# Patient Record
Sex: Female | Born: 1980 | Race: Asian | Hispanic: No | Smoking: Never smoker
Health system: Southern US, Community
[De-identification: ages and names within clinical notes are randomized; demographics above are authoritative.]

## PROBLEM LIST (undated history)

## (undated) DIAGNOSIS — D649 Anemia, unspecified: Secondary | ICD-10-CM

## (undated) DIAGNOSIS — K219 Gastro-esophageal reflux disease without esophagitis: Secondary | ICD-10-CM

## (undated) HISTORY — DX: Anemia, unspecified: D64.9

---

## 2006-05-06 ENCOUNTER — Inpatient Hospital Stay (HOSPITAL_COMMUNITY): Admission: AD | Admit: 2006-05-06 | Discharge: 2006-05-08 | Payer: Self-pay | Admitting: Obstetrics & Gynecology

## 2006-05-06 ENCOUNTER — Ambulatory Visit: Payer: Self-pay | Admitting: Certified Nurse Midwife

## 2008-06-24 ENCOUNTER — Ambulatory Visit: Payer: Self-pay | Admitting: Family

## 2008-06-24 ENCOUNTER — Ambulatory Visit: Payer: Self-pay | Admitting: Obstetrics & Gynecology

## 2008-06-24 ENCOUNTER — Inpatient Hospital Stay (HOSPITAL_COMMUNITY): Admission: AD | Admit: 2008-06-24 | Discharge: 2008-06-26 | Payer: Self-pay | Admitting: Obstetrics & Gynecology

## 2008-06-24 ENCOUNTER — Encounter: Payer: Self-pay | Admitting: Family

## 2008-06-24 LAB — CONVERTED CEMR LAB
ALT: 31 units/L (ref 0–35)
AST: 48 units/L — ABNORMAL HIGH (ref 0–37)
Antibody Screen: NEGATIVE
BUN: 8 mg/dL (ref 6–23)
CO2: 23 meq/L (ref 19–32)
Eosinophils Absolute: 0.3 10*3/uL (ref 0.0–0.7)
Eosinophils Relative: 4 % (ref 0–5)
HCT: 37.1 % (ref 36.0–46.0)
Lymphocytes Relative: 24 % (ref 12–46)
Lymphs Abs: 1.7 10*3/uL (ref 0.7–4.0)
MCHC: 31 g/dL (ref 30.0–36.0)
Monocytes Absolute: 0.5 10*3/uL (ref 0.1–1.0)
Neutrophils Relative %: 64 % (ref 43–77)
Platelets: 347 10*3/uL (ref 150–400)
Potassium: 4 meq/L (ref 3.5–5.3)
RDW: 13.9 % (ref 11.5–15.5)
Rubella: 49.3 intl units/mL — ABNORMAL HIGH
Total Bilirubin: 0.5 mg/dL (ref 0.3–1.2)
Total Protein: 6.8 g/dL (ref 6.0–8.3)

## 2008-12-22 ENCOUNTER — Emergency Department (HOSPITAL_COMMUNITY): Admission: EM | Admit: 2008-12-22 | Discharge: 2008-12-22 | Payer: Self-pay | Admitting: Emergency Medicine

## 2009-01-20 ENCOUNTER — Encounter: Payer: Self-pay | Admitting: Family Medicine

## 2009-01-20 ENCOUNTER — Ambulatory Visit: Payer: Self-pay | Admitting: Family Medicine

## 2009-01-20 DIAGNOSIS — R5383 Other fatigue: Secondary | ICD-10-CM

## 2009-01-20 DIAGNOSIS — K219 Gastro-esophageal reflux disease without esophagitis: Secondary | ICD-10-CM | POA: Insufficient documentation

## 2009-01-20 DIAGNOSIS — R5381 Other malaise: Secondary | ICD-10-CM

## 2009-01-20 HISTORY — DX: Gastro-esophageal reflux disease without esophagitis: K21.9

## 2009-01-28 ENCOUNTER — Encounter: Payer: Self-pay | Admitting: Family Medicine

## 2009-01-28 LAB — CONVERTED CEMR LAB
HCT: 38.5 % (ref 36.0–46.0)
HDL: 41 mg/dL (ref >39)
LDL Cholesterol: 62 mg/dL (ref 0–99)
MCV: 70.5 fL — ABNORMAL LOW (ref 78.0–100.0)
Platelets: 391 10*3/uL (ref 150–400)
RBC: 5.46 M/uL — ABNORMAL HIGH (ref 3.87–5.11)
Total CHOL/HDL Ratio: 3
Triglycerides: 89 mg/dL (ref <150)
WBC: 7.3 10*3/uL (ref 4.0–10.5)

## 2009-04-15 ENCOUNTER — Ambulatory Visit: Payer: Self-pay | Admitting: Family Medicine

## 2009-04-15 DIAGNOSIS — R1013 Epigastric pain: Secondary | ICD-10-CM | POA: Insufficient documentation

## 2009-04-15 LAB — CONVERTED CEMR LAB: H Pylori IgG: NEGATIVE

## 2009-04-25 ENCOUNTER — Emergency Department (HOSPITAL_COMMUNITY): Admission: EM | Admit: 2009-04-25 | Discharge: 2009-04-26 | Payer: Self-pay | Admitting: Emergency Medicine

## 2009-04-28 ENCOUNTER — Encounter: Payer: Self-pay | Admitting: Family Medicine

## 2009-04-28 ENCOUNTER — Ambulatory Visit: Payer: Self-pay | Admitting: Family Medicine

## 2009-04-28 DIAGNOSIS — O039 Complete or unspecified spontaneous abortion without complication: Secondary | ICD-10-CM | POA: Insufficient documentation

## 2009-04-28 LAB — CONVERTED CEMR LAB
Hemoglobin: 7.3 g/dL — CL (ref 12.0–15.0)
RDW: 15.9 % — ABNORMAL HIGH (ref 11.5–15.5)
hCG, Beta Chain, Quant, S: 134 milliintl units/mL

## 2009-04-30 ENCOUNTER — Ambulatory Visit (HOSPITAL_COMMUNITY): Admission: RE | Admit: 2009-04-30 | Discharge: 2009-04-30 | Payer: Self-pay | Admitting: Family Medicine

## 2009-05-06 ENCOUNTER — Ambulatory Visit: Payer: Self-pay | Admitting: Family Medicine

## 2009-05-06 ENCOUNTER — Encounter: Payer: Self-pay | Admitting: Family Medicine

## 2009-05-06 DIAGNOSIS — D369 Benign neoplasm, unspecified site: Secondary | ICD-10-CM | POA: Insufficient documentation

## 2009-05-06 DIAGNOSIS — D270 Benign neoplasm of right ovary: Secondary | ICD-10-CM | POA: Insufficient documentation

## 2009-05-06 LAB — CONVERTED CEMR LAB
HCT: 28 % — ABNORMAL LOW (ref 36.0–46.0)
Hemoglobin: 8.9 g/dL — ABNORMAL LOW (ref 12.0–15.0)
MCHC: 31.6 g/dL (ref 30.0–36.0)

## 2010-07-17 NOTE — L&D Delivery Note (Signed)
Delivery Note Pt came in C/C/+3, membranes intact.  After about 3 contractions, at 10:26 PM a viable female was delivered via Vaginal, Spontaneous Delivery (Presentation:left Occiput Anterior). Membranes ruptured spontaneously with delivery of head, clear fluid. APGAR: 9, 9; weight pending..   Placenta status: intact, .  Cord: 3 vessels with the following complications: None.  Cord pH: not done.  Delivery by Joellyn Haff, SNM.  Anesthesia: None  Episiotomy: None Lacerations: None Suture Repair:  Est. Blood Loss (mL):   Mom to postpartum.  Baby to nursery-stable.  Cassandra Lee,Cassandra Lee 04/20/2011, 10:50 PM

## 2010-08-07 ENCOUNTER — Encounter: Payer: Self-pay | Admitting: Family Medicine

## 2010-08-29 ENCOUNTER — Encounter: Payer: Self-pay | Admitting: *Deleted

## 2010-10-20 LAB — DIFFERENTIAL
Basophils Absolute: 0.1 10*3/uL (ref 0.0–0.1)
Basophils Absolute: 0.1 10*3/uL (ref 0.0–0.1)
Eosinophils Absolute: 0.1 10*3/uL (ref 0.0–0.7)
Eosinophils Relative: 1 % (ref 0–5)
Eosinophils Relative: 4 % (ref 0–5)
Lymphocytes Relative: 11 % — ABNORMAL LOW (ref 12–46)
Lymphs Abs: 1.6 10*3/uL (ref 0.7–4.0)
Monocytes Relative: 3 % (ref 3–12)
Neutrophils Relative %: 81 % — ABNORMAL HIGH (ref 43–77)

## 2010-10-20 LAB — CROSSMATCH: Antibody Screen: NEGATIVE

## 2010-10-20 LAB — CBC
HCT: 33 % — ABNORMAL LOW (ref 36.0–46.0)
Hemoglobin: 10.6 g/dL — ABNORMAL LOW (ref 12.0–15.0)
Hemoglobin: 8.2 g/dL — ABNORMAL LOW (ref 12.0–15.0)
MCHC: 32 g/dL (ref 30.0–36.0)
Platelets: 276 10*3/uL (ref 150–400)
RBC: 3.61 MIL/uL — ABNORMAL LOW (ref 3.87–5.11)
RDW: 15.2 % (ref 11.5–15.5)
RDW: 15.3 % (ref 11.5–15.5)
WBC: 15.5 10*3/uL — ABNORMAL HIGH (ref 4.0–10.5)

## 2010-10-20 LAB — ETHANOL: Alcohol, Ethyl (B): <5 mg/dL (ref 0–10)

## 2010-10-20 LAB — COMPREHENSIVE METABOLIC PANEL
ALT: 21 U/L (ref 0–35)
Alkaline Phosphatase: 51 U/L (ref 39–117)
BUN: 14 mg/dL (ref 6–23)
Calcium: 8.4 mg/dL (ref 8.4–10.5)
Chloride: 112 mEq/L (ref 96–112)
GFR calc Af Amer: >60 mL/min (ref >60)
Potassium: 3.1 mEq/L — ABNORMAL LOW (ref 3.5–5.1)
Total Bilirubin: 0.7 mg/dL (ref 0.3–1.2)

## 2010-10-20 LAB — URINALYSIS, ROUTINE W REFLEX MICROSCOPIC
Bilirubin Urine: NEGATIVE
Ketones, ur: 15 mg/dL — AB
Nitrite: NEGATIVE
Protein, ur: 30 mg/dL — AB
Specific Gravity, Urine: 1.024 (ref 1.005–1.030)
pH: 5.5 (ref 5.0–8.0)

## 2010-10-20 LAB — RAPID URINE DRUG SCREEN, HOSP PERFORMED
Opiates: NOT DETECTED
Tetrahydrocannabinol: NOT DETECTED

## 2010-10-20 LAB — URINE MICROSCOPIC-ADD ON

## 2010-10-20 LAB — POCT CARDIAC MARKERS
CKMB, poc: 1 ng/mL (ref 1.0–8.0)
Myoglobin, poc: 57.7 ng/mL (ref 12–200)
Troponin i, poc: <0.05 ng/mL (ref 0.00–0.09)

## 2010-10-24 LAB — POCT I-STAT, CHEM 8
Creatinine, Ser: 0.7 mg/dL (ref 0.4–1.2)
Hemoglobin: 15 g/dL (ref 12.0–15.0)
Sodium: 138 mEq/L (ref 135–145)
TCO2: 26 mmol/L (ref 0–100)

## 2010-10-24 LAB — POCT URINALYSIS DIP (DEVICE)
Glucose, UA: NEGATIVE mg/dL
Hgb urine dipstick: NEGATIVE
Nitrite: NEGATIVE
Urobilinogen, UA: 1 mg/dL (ref 0.0–1.0)

## 2010-10-24 LAB — POCT PREGNANCY, URINE: Preg Test, Ur: NEGATIVE

## 2010-11-21 ENCOUNTER — Other Ambulatory Visit: Payer: Self-pay | Admitting: Family Medicine

## 2010-11-21 DIAGNOSIS — Z3689 Encounter for other specified antenatal screening: Secondary | ICD-10-CM

## 2010-11-29 NOTE — Discharge Summary (Signed)
Cassandra Lee, Cassandra Lee                     ACCOUNT NO.:  [id number]   MEDICAL RECORD NO.:  [id number]          PATIENT TYPE:  INP   LOCATION:  9374                          FACILITY:  [facility]   PHYSICIAN:  Norton Blizzard, MD    DATE OF BIRTH:  [birth year]   DATE OF ADMISSION:  06/24/2008  DATE OF DISCHARGE:  06/26/2008                               DISCHARGE SUMMARY   DISCHARGE DIAGNOSES:  1. Term intrauterine pregnancy, delivered.  2. Status post normal spontaneous vaginal delivery.  3. No prenatal care.  4. Preeclampsia.   REASON FOR ADMISSION:  Cassandra Lee is a 30 year old, gravida 2, now para 2,  who presented with no prenatal care and elevated blood pressures,  elevated liver enzymes, and elevated uric acid.  Her initial ultrasound  was performed on the day of her admission and she was found to have a 6-  pound 8-ounce baby and be approximately 35 weeks and 2 days by  ultrasound on the day of admission.   HOSPITAL COURSE:  The patient was admitted and found to be in active  labor.  She has started magnesium sulfate given her elevated liver  enzymes, her elevated blood pressure, and her elevated uric acid.  Her  labor progressed in an uncomplicated manner and she had a normal  spontaneous vaginal delivery of a 7-pound 0-ounce female baby with  Apgars of 9 and 9.  Postoperatively, she was transferred to the [facility] for  24 hours of magnesium sulfate therapy.  Upon completion of her magnesium  sulfate therapy, her blood pressure was completely normal in the 110/70s  range.  Her physical exam is unremarkable.  Her urine output is  excellent.  She is ambulating and tolerating a full diet.  Her lochia is  decreasing and she has minimal pain, well controlled with non-narcotic  pain relievers.  At the time of discharge, an interpreter was here, who  explained to her all the discharge instructions to include the  importance of a followup in 6 weeks for her postpartum exam and followup  for continued  birth control be it through Depo-Provera or possible IUD  or Implanon.  The patient will be given a shot of Depo-Provera before  she leaves the hospital.   CONDITION AT DISCHARGE:  Good.   INSTRUCTIONS FOR DISCHARGE:  As per hospital course.  The patient was  given a prescription for Ibuprofen 600 mg every 6 hours as needed for  pain.  Additionally, paper work was completed for the patient to return  to work after 6 weeks.  The patient's questions were answered and, via  the interpreter, she voiced understanding of the above instructions.      Odie Sera, DO  Electronically Signed     ______________________________  Norton Blizzard, MD    MC/MEDQ  D:  06/26/2008  T:  06/27/2008  Job:  [id number]

## 2010-12-19 ENCOUNTER — Ambulatory Visit (HOSPITAL_COMMUNITY)
Admission: RE | Admit: 2010-12-19 | Discharge: 2010-12-19 | Disposition: A | Discharge status: Home / Self Care | Payer: 59 | Source: Ambulatory Visit | Attending: Family Medicine | Admitting: Family Medicine

## 2010-12-19 DIAGNOSIS — Z3689 Encounter for other specified antenatal screening: Secondary | ICD-10-CM

## 2010-12-19 DIAGNOSIS — O358XX0 Maternal care for other (suspected) fetal abnormality and damage, not applicable or unspecified: Secondary | ICD-10-CM | POA: Insufficient documentation

## 2010-12-19 DIAGNOSIS — Z1389 Encounter for screening for other disorder: Secondary | ICD-10-CM | POA: Insufficient documentation

## 2010-12-19 DIAGNOSIS — Z363 Encounter for antenatal screening for malformations: Secondary | ICD-10-CM | POA: Insufficient documentation

## 2011-02-13 ENCOUNTER — Inpatient Hospital Stay (HOSPITAL_COMMUNITY): Admission: AD | Admit: 2011-02-13 | Payer: Self-pay | Source: Ambulatory Visit | Admitting: Obstetrics and Gynecology

## 2011-04-20 ENCOUNTER — Encounter (HOSPITAL_COMMUNITY): Payer: Self-pay | Admitting: *Deleted

## 2011-04-20 ENCOUNTER — Inpatient Hospital Stay (HOSPITAL_COMMUNITY)
Admission: AD | Admit: 2011-04-20 | Discharge: 2011-04-22 | DRG: 775 | Disposition: A | Discharge status: Home / Self Care | Payer: 59 | Source: Ambulatory Visit | Attending: Obstetrics & Gynecology | Admitting: Obstetrics & Gynecology

## 2011-04-20 ENCOUNTER — Other Ambulatory Visit: Payer: Self-pay | Admitting: Family Medicine

## 2011-04-20 MED ORDER — ERYTHROMYCIN 5 MG/GM OP OINT
TOPICAL_OINTMENT | OPHTHALMIC | Status: AC
  Filled 2011-04-20: qty 1

## 2011-04-20 MED ORDER — OXYCODONE-ACETAMINOPHEN 5-325 MG PO TABS
1.0000 | ORAL_TABLET | ORAL | Status: DC | PRN
  Administered 2011-04-20 – 2011-04-21 (×3): 1 via ORAL
  Filled 2011-04-20 (×3): qty 1

## 2011-04-20 MED ORDER — IBUPROFEN 600 MG PO TABS
600.0000 mg | ORAL_TABLET | Freq: Four times a day (QID) | ORAL | Status: DC
  Administered 2011-04-21 – 2011-04-22 (×7): 600 mg via ORAL
  Filled 2011-04-20 (×7): qty 1

## 2011-04-20 MED ORDER — OXYTOCIN 10 UNIT/ML IJ SOLN
INTRAMUSCULAR | Status: AC
  Filled 2011-04-20: qty 1

## 2011-04-20 NOTE — Progress Notes (Signed)
PT FROM [place]- INTERPRETER- [name]-    SAYS HURT BAD   AT 9PM.  VE IN  OFFICE  3 CM- UNSURE IF SROM IN OFFICE- FOR U/S IN AM.

## 2011-04-20 NOTE — Progress Notes (Signed)
Pt was in bathroom in MAU and reported that the baby was "coming out". Pt transferred to MAU room 10 for delivery.

## 2011-04-20 NOTE — Progress Notes (Signed)
Pt went to b-room- WITH INTERPRETER- SAYS BABY COMING. - TO ROOM VIA W/C. VE - COMPLETE/.

## 2011-04-21 ENCOUNTER — Ambulatory Visit (HOSPITAL_COMMUNITY): Payer: Medicaid Other

## 2011-04-21 ENCOUNTER — Encounter (HOSPITAL_COMMUNITY): Payer: Self-pay | Admitting: *Deleted

## 2011-04-21 LAB — POCT URINALYSIS DIP (DEVICE)
Glucose, UA: NEGATIVE mg/dL
Hgb urine dipstick: NEGATIVE
Nitrite: NEGATIVE
Protein, ur: 30 mg/dL — AB
Urobilinogen, UA: 0.2 mg/dL (ref 0.0–1.0)

## 2011-04-21 LAB — RPR: RPR Ser Ql: NONREACTIVE

## 2011-04-21 LAB — CBC
HCT: 28.6 % — ABNORMAL LOW (ref 36.0–46.0)
HCT: 33.5 % — ABNORMAL LOW (ref 36.0–46.0)
MCHC: 31.5 g/dL (ref 30.0–36.0)
MCV: 73.1 fL — ABNORMAL LOW (ref 78.0–100.0)
Platelets: 270 10*3/uL (ref 150–400)
Platelets: 316 10*3/uL (ref 150–400)
Platelets: 358 10*3/uL (ref 150–400)
RBC: 4.94 MIL/uL (ref 3.87–5.11)
RDW: 14 % (ref 11.5–15.5)
RDW: 14 % (ref 11.5–15.5)
WBC: 12.6 10*3/uL — ABNORMAL HIGH (ref 4.0–10.5)
WBC: 7.2 10*3/uL (ref 4.0–10.5)
WBC: 7.2 10*3/uL (ref 4.0–10.5)

## 2011-04-21 LAB — ANA: Anti Nuclear Antibody(ANA): NEGATIVE

## 2011-04-21 MED ORDER — WITCH HAZEL-GLYCERIN EX PADS: 1.0000 "application " | MEDICATED_PAD | CUTANEOUS | Status: DC | PRN

## 2011-04-21 MED ORDER — BENZOCAINE-MENTHOL 20-0.5 % EX AERO: 1.0000 "application " | INHALATION_SPRAY | CUTANEOUS | Status: DC | PRN

## 2011-04-21 MED ORDER — LANOLIN HYDROUS EX OINT: TOPICAL_OINTMENT | CUTANEOUS | Status: DC | PRN

## 2011-04-21 MED ORDER — ZOLPIDEM TARTRATE 5 MG PO TABS: 5.0000 mg | ORAL_TABLET | Freq: Every evening | ORAL | Status: DC | PRN

## 2011-04-21 MED ORDER — PRENATAL PLUS 27-1 MG PO TABS
1.0000 | ORAL_TABLET | Freq: Every day | ORAL | Status: DC
  Administered 2011-04-21 – 2011-04-22 (×2): 1 via ORAL
  Filled 2011-04-21 (×2): qty 1

## 2011-04-21 MED ORDER — DIBUCAINE 1 % RE OINT: 1.0000 "application " | TOPICAL_OINTMENT | RECTAL | Status: DC | PRN

## 2011-04-21 MED ORDER — SIMETHICONE 80 MG PO CHEW: 80.0000 mg | CHEWABLE_TABLET | ORAL | Status: DC | PRN

## 2011-04-21 MED ORDER — DIPHENHYDRAMINE HCL 25 MG PO CAPS: 25.0000 mg | ORAL_CAPSULE | Freq: Four times a day (QID) | ORAL | Status: DC | PRN

## 2011-04-21 MED ORDER — MEASLES, MUMPS & RUBELLA VAC ~~LOC~~ INJ
0.5000 mL | INJECTION | Freq: Once | SUBCUTANEOUS | Status: DC
Start: 2011-04-22 — End: 2011-04-22
  Filled 2011-04-21: qty 0.5

## 2011-04-21 MED ORDER — ONDANSETRON HCL 4 MG PO TABS: 4.0000 mg | ORAL_TABLET | ORAL | Status: DC | PRN

## 2011-04-21 MED ORDER — SENNOSIDES-DOCUSATE SODIUM 8.6-50 MG PO TABS
2.0000 | ORAL_TABLET | Freq: Every day | ORAL | Status: DC
  Administered 2011-04-21: 2 via ORAL

## 2011-04-21 MED ORDER — TETANUS-DIPHTH-ACELL PERTUSSIS 5-2.5-18.5 LF-MCG/0.5 IM SUSP
0.5000 mL | Freq: Once | INTRAMUSCULAR | Status: AC
  Administered 2011-04-22: 0.5 mL via INTRAMUSCULAR
  Filled 2011-04-21: qty 0.5

## 2011-04-21 MED ORDER — ONDANSETRON HCL 4 MG/2ML IJ SOLN: 4.0000 mg | INTRAMUSCULAR | Status: DC | PRN

## 2011-04-21 NOTE — Progress Notes (Signed)
Post Partum Day 1 Subjective: no complaints, up ad lib, voiding and tolerating PO, small lochia, plans to breastfeed, condoms  Objective: Blood pressure 100/60, pulse 67, temperature 97.8 F (36.6 C), temperature source Oral, resp. rate 16, height 4\' 11"  (1.499 m), SpO2 98.00%.  Physical Exam:  General: alert, cooperative and no distress Lochia:normal flow Chest: CTAB Heart: RRR no m/r/g Abdomen: +BS, soft, nontender,  Uterine Fundus: firm DVT Evaluation: No evidence of DVT seen on physical exam. Extremities: 0 edema  No results found for this basename: HGB:2,HCT:2 in the last 72 hours  Assessment/Plan: Plan for discharge tomorrow   LOS: 1 day   CRESENZO-DISHMAN,Whitleigh Garramone 04/21/2011, 8:03 AM

## 2011-04-21 NOTE — Progress Notes (Signed)
UR Chart review completed.  

## 2011-04-22 MED ORDER — INFLUENZA VIRUS VACC SPLIT PF IM SUSP
0.5000 mL | Freq: Once | INTRAMUSCULAR | Status: AC
  Administered 2011-04-22: 0.5 mL via INTRAMUSCULAR
  Filled 2011-04-22: qty 0.5

## 2011-04-22 MED ORDER — DOCUSATE SODIUM 100 MG PO CAPS: 100.0000 mg | ORAL_CAPSULE | Freq: Two times a day (BID) | ORAL | Status: AC

## 2011-04-22 MED ORDER — IBUPROFEN 600 MG PO TABS: 600.0000 mg | ORAL_TABLET | Freq: Four times a day (QID) | ORAL | Status: AC

## 2011-04-22 MED ORDER — MEDROXYPROGESTERONE ACETATE 150 MG/ML IM SUSP
150.0000 mg | Freq: Once | INTRAMUSCULAR | Status: AC
  Administered 2011-04-22: 150 mg via INTRAMUSCULAR
  Filled 2011-04-22: qty 1

## 2011-04-22 NOTE — Discharge Summary (Signed)
Obstetric Discharge Summary Reason for Admission: onset of labor Prenatal Procedures: none Intrapartum Procedures: spontaneous vaginal delivery Postpartum Procedures: none Complications-Operative and Postpartum: none Hemoglobin  Date Value Range Status  05/06/2009 8.9* 12.0-15.0 (g/dL) Final     HCT  Date Value Range Status  05/06/2009 28.0* 36.0-46.0 (%) Final    Discharge Diagnoses: Term Pregnancy-delivered  Discharge Information: Date: 04/22/2011 Activity: pelvic rest Diet: routine Medications: Ibuprofen and Colace Condition: stable Instructions: refer to practice specific booklet Discharge to: home   Newborn Data: Live born female  Birth Weight: 7 lb 9.9 oz (3456 g) APGAR: 9, 9  Home with staying for elevated bilirubin.  STINSON, JACOB JEHIEL 04/22/2011, 11:06 AM

## 2011-04-22 NOTE — Progress Notes (Signed)
Post Partum Day 2 Subjective: no complaints, up ad lib, voiding and tolerating PO  Objective: Blood pressure 112/70, pulse 70, temperature 97.9 F (36.6 C), temperature source Oral, resp. rate 18, height 4\' 11"  (1.499 m), SpO2 98.00%.  Physical Exam:  General: alert, cooperative and no distress Lochia: appropriate Uterine Fundus: firm Incision: no incision DVT Evaluation: No cords or calf tenderness. No significant calf/ankle edema.  No results found for this basename: HGB:2,HCT:2 in the last 72 hours  Assessment/Plan: Discharge home and Breastfeeding   LOS: 2 days   Royann Wildasin JEHIEL 04/22/2011, 11:05 AM

## 2011-04-27 ENCOUNTER — Encounter (HOSPITAL_COMMUNITY): Payer: Self-pay | Admitting: *Deleted

## 2011-05-02 NOTE — H&P (Signed)
Cassandra Lee is a 30 y.o. female [phone] with IUP at [redacted]w[redacted]d presenting for labor. Pt states she has been having regular, every 3 minutes, associated with none vaginal bleeding, intact, with active.  Marland Kitchen  [facility] at [facility] since 14 wks  Prenatal History/Complications:  Past Medical History: History reviewed. No pertinent past medical history.  Past Surgical History: History reviewed. No pertinent past surgical history.  Obstetrical History: OB History    Grav Para Term Preterm Abortions TAB SAB Ect Mult Living   4 3 3  1  1   3       Gynecological History:negative   Social History: History   Social History  . Marital Status: Married    Spouse Name: N/A    Number of Children: N/A  . Years of Education: N/A   Social History Main Topics  . Smoking status: Never Smoker   . Smokeless tobacco: None  . Alcohol Use: No  . Drug Use: No  . Sexually Active:    Other Topics Concern  . None   Social History Narrative  . None    Family History: No family history on file.  Allergies: No Known Allergies  No prescriptions prior to admission    Review of Systems - Negative except contractions for 3 hours   Blood pressure 112/70, pulse 70, temperature 97.9 F (36.6 C), temperature source Oral, resp. rate 18, height 4\' 11"  (1.499 m), SpO2 98.00%, unknown if currently breastfeeding. General appearance: alert and moderate distress Lungs: clear to auscultation bilaterally Heart: regular rate and rhythm Abdomen: soft, non-tender; bowel sounds normal Pelvic: C/C/+3 Extremities: Homans sign is negative, no sign of DVT DTR's 2+ Presentation: cephalic Baseline: 140 bpm, avg LTV, + accels, no decels COntractions q 2 minutes, strong     Prenatal labs: ABO, Rh:  O+ Antibody:  neg Rubella:  immune RPR:   neg HBsAg:   neg HIV:   neg GBS:   neg 1 hr Glucola 79 genetic screening  Neg anatomy US normal   Assessment: Cassandra Lee is a 30 y.o. [id number] with an IUP at [redacted]w[redacted]d presenting for  advanced labor  Plan: Delivery imminent    CRESENZO-DISHMAN,Joey Hudock 05/02/2011, 11:34 AM

## 2012-07-17 NOTE — L&D Delivery Note (Signed)
Delivery Note At 8:29 PM a viable and healthy female was delivered via Vaginal, Spontaneous Delivery (Presentation: left occiput anterior).  APGAR: 9, 9; weight pending Placenta status: in tact, spontaneous.  Cord: 3-vessel  with the following complications: none.    Anesthesia:  None Episiotomy: None Lacerations: None Suture Repair: n/a Est. Blood Loss (mL): 250  Mom to postpartum.  Baby to nursery-stable.  Dr Ike Bene was present for entire delivery and delivered infant, I delivered placenta, no complications.  Simone Curia 04/20/2013, 9:15 PM   I spoke with and examined patient and agree with resident's note and plan of care.  Tawana Scale, MD OB Fellow 04/20/2013 9:42 PM

## 2012-07-17 NOTE — L&D Delivery Note (Signed)
Attestation of Attending Supervision of Advanced Practitioner (CNM/NP): Evaluation and management procedures were performed by the Advanced Practitioner under my supervision and collaboration. I have reviewed the Advanced Practitioner's note and chart, and I agree with the management and plan.  Alika Saladin H. 3:41 PM

## 2012-08-29 ENCOUNTER — Encounter: Payer: Self-pay | Admitting: Family Medicine

## 2012-09-03 ENCOUNTER — Encounter: Payer: Self-pay | Admitting: Family Medicine

## 2012-09-03 ENCOUNTER — Ambulatory Visit (INDEPENDENT_AMBULATORY_CARE_PROVIDER_SITE_OTHER): Payer: Medicaid Other | Admitting: Family Medicine

## 2012-09-03 VITALS — BP 123/84 | HR 83 | Temp 98.4°F | Ht 59.0 in | Wt 106.0 lb

## 2012-09-03 DIAGNOSIS — R51 Headache: Secondary | ICD-10-CM

## 2012-09-03 DIAGNOSIS — R519 Headache, unspecified: Secondary | ICD-10-CM | POA: Insufficient documentation

## 2012-09-03 DIAGNOSIS — N912 Amenorrhea, unspecified: Secondary | ICD-10-CM

## 2012-09-03 DIAGNOSIS — R1013 Epigastric pain: Secondary | ICD-10-CM

## 2012-09-03 DIAGNOSIS — R300 Dysuria: Secondary | ICD-10-CM

## 2012-09-03 DIAGNOSIS — Z3201 Encounter for pregnancy test, result positive: Secondary | ICD-10-CM

## 2012-09-03 LAB — POCT URINALYSIS DIPSTICK
Bilirubin, UA: NEGATIVE
Blood, UA: NEGATIVE
Glucose, UA: NEGATIVE
Leukocytes, UA: NEGATIVE
Nitrite, UA: NEGATIVE
Urobilinogen, UA: 0.2

## 2012-09-03 LAB — POCT UA - MICROSCOPIC ONLY

## 2012-09-03 LAB — POCT URINE PREGNANCY: Preg Test, Ur: POSITIVE

## 2012-09-03 MED ORDER — RANITIDINE HCL 150 MG PO TABS: 150.0000 mg | ORAL_TABLET | Freq: Two times a day (BID) | ORAL | Status: DC

## 2012-09-03 MED ORDER — PRENATAL PLUS 27-1 MG PO TABS: 1.0000 | ORAL_TABLET | Freq: Every day | ORAL | Status: DC

## 2012-09-03 NOTE — Assessment & Plan Note (Addendum)
Possibly related to mild dehydration.  No neurological deficits.  Advised she can use tyelnol for pain and be sure to hydrate.

## 2012-09-03 NOTE — Assessment & Plan Note (Addendum)
Positive UPT in clinic today.  She is unsure of date of LMP, thinks it was sometime in early January.  Will send for [place] for dating.  Advised her to start prenatal vitamin and schedule appointment for prenatal visit either with [place] or with [facility] where she was seen with previous pregnancy.  Urine does not show anything other than mild proteinuria and ketones, appears to have some dehydration. Advised to be sure she is getting plenty of fluids.

## 2012-09-03 NOTE — Patient Instructions (Addendum)
Your urine pregnancy test shows that you are pregnant. I would like to send you for an ulrasound since you are unsure of your dates. I think your abdominal pain is related to acid reflux, you can use tums or maalox for this You should start a prenatal vitamin You can get your prenatal care here or at the [facility] health dept as before.  Be sure to drink plenty of fluids each day.

## 2012-09-03 NOTE — Assessment & Plan Note (Signed)
Symptoms consistent with reflux.  Advised tums or maalox for control since she is pregnant.

## 2012-09-03 NOTE — Progress Notes (Signed)
  Subjective:    Patient ID: Cassandra Lee, female    DOB: 1980-10-20, 32 y.o.   MRN: [id number]  HPI  1. GI problem:  Complaint of epigastric pain for the past few months.  Often has sour taste in her mouth and burning pain in her chest.  She does endorse some mild nausea as well.  She denies any vomiting and has not had any dark stools.  SHe has not tried any medications for this.   2. Headache:  C/o headache for the past 2 days.  Some dizziness associated with this as well.  Headache is on L side and also in her neck.  She denies weakness, vision changes, numbness.   3. Dysuria:  Dysuria x2-3 days, with some frequency.  She does not have any flank pain, fever, chills, or hematuria.  Denies vaginal discharge.  Her LMP was in early January and she missed her period this month   History reviewed.  Pregnancy history reviewed, history of spontaneous miscarriage with last pregnancy.  Review of Systems Per HPI    Objective:   Physical Exam  Constitutional: She appears well-nourished. No distress.  HENT:  Head: Normocephalic and atraumatic.  Eyes: EOM are normal. Pupils are equal, round, and reactive to light.  Neck: Neck supple.  Cardiovascular: Normal rate.   Abdominal: Soft. Bowel sounds are normal. She exhibits no distension. There is tenderness (MIld epigastric tenderness.).  Musculoskeletal: She exhibits no edema.  Neurological: She is alert. No cranial nerve deficit. Coordination normal.          Assessment & Plan:

## 2012-09-05 ENCOUNTER — Other Ambulatory Visit: Payer: Self-pay | Admitting: Family Medicine

## 2012-09-05 ENCOUNTER — Ambulatory Visit (HOSPITAL_COMMUNITY)
Admission: RE | Admit: 2012-09-05 | Discharge: 2012-09-05 | Disposition: A | Discharge status: Home / Self Care | Payer: Medicaid Other | Source: Ambulatory Visit | Attending: Family Medicine | Admitting: Family Medicine

## 2012-09-05 DIAGNOSIS — O99891 Other specified diseases and conditions complicating pregnancy: Secondary | ICD-10-CM | POA: Insufficient documentation

## 2012-09-05 DIAGNOSIS — Z3689 Encounter for other specified antenatal screening: Secondary | ICD-10-CM | POA: Insufficient documentation

## 2012-09-05 DIAGNOSIS — R109 Unspecified abdominal pain: Secondary | ICD-10-CM | POA: Insufficient documentation

## 2012-09-05 DIAGNOSIS — Z3201 Encounter for pregnancy test, result positive: Secondary | ICD-10-CM

## 2012-09-13 ENCOUNTER — Telehealth: Payer: Self-pay | Admitting: Family Medicine

## 2012-09-13 NOTE — Telephone Encounter (Signed)
Confirmed pregnancy, needs NOB appt scheduled.

## 2012-09-13 NOTE — Telephone Encounter (Signed)
NOB appointments scheduled.  Interpreter notified.  Ileana Ladd

## 2012-09-23 ENCOUNTER — Other Ambulatory Visit: Payer: Medicaid Other

## 2012-09-23 DIAGNOSIS — Z331 Pregnant state, incidental: Secondary | ICD-10-CM

## 2012-09-23 NOTE — Progress Notes (Signed)
PRENATAL LABS DONE TODAY Cassandra Lee 

## 2012-09-24 LAB — OBSTETRIC PANEL
Antibody Screen: NEGATIVE
Basophils Absolute: 0.1 10*3/uL (ref 0.0–0.1)
Basophils Relative: 1 % (ref 0–1)
Eosinophils Relative: 7 % — ABNORMAL HIGH (ref 0–5)
HCT: 36.4 % (ref 36.0–46.0)
Hemoglobin: 11.1 g/dL — ABNORMAL LOW (ref 12.0–15.0)
MCH: 21.7 pg — ABNORMAL LOW (ref 26.0–34.0)
MCHC: 30.5 g/dL (ref 30.0–36.0)
MCV: 71.2 fL — ABNORMAL LOW (ref 78.0–100.0)
Monocytes Absolute: 0.5 10*3/uL (ref 0.1–1.0)
Monocytes Relative: 5 % (ref 3–12)
Neutro Abs: 6.3 10*3/uL (ref 1.7–7.7)
RDW: 16.3 % — ABNORMAL HIGH (ref 11.5–15.5)
Rh Type: POSITIVE

## 2012-09-24 LAB — CULTURE, OB URINE
Colony Count: NO GROWTH
Organism ID, Bacteria: NO GROWTH

## 2012-09-24 LAB — HIV ANTIBODY (ROUTINE TESTING W REFLEX): HIV: NONREACTIVE

## 2012-09-27 ENCOUNTER — Encounter: Payer: Self-pay | Admitting: Family Medicine

## 2012-09-27 DIAGNOSIS — Z3480 Encounter for supervision of other normal pregnancy, unspecified trimester: Secondary | ICD-10-CM | POA: Insufficient documentation

## 2012-09-30 ENCOUNTER — Ambulatory Visit (INDEPENDENT_AMBULATORY_CARE_PROVIDER_SITE_OTHER): Payer: Medicaid Other | Admitting: Family Medicine

## 2012-09-30 ENCOUNTER — Other Ambulatory Visit (HOSPITAL_COMMUNITY)
Admission: RE | Admit: 2012-09-30 | Discharge: 2012-09-30 | Disposition: A | Discharge status: Home / Self Care | Payer: Medicaid Other | Source: Ambulatory Visit | Attending: Family Medicine | Admitting: Family Medicine

## 2012-09-30 ENCOUNTER — Encounter: Payer: Self-pay | Admitting: Family Medicine

## 2012-09-30 VITALS — BP 115/79 | Temp 98.1°F | Wt 107.8 lb

## 2012-09-30 DIAGNOSIS — Z113 Encounter for screening for infections with a predominantly sexual mode of transmission: Secondary | ICD-10-CM | POA: Insufficient documentation

## 2012-09-30 DIAGNOSIS — D509 Iron deficiency anemia, unspecified: Secondary | ICD-10-CM | POA: Insufficient documentation

## 2012-09-30 DIAGNOSIS — Z3481 Encounter for supervision of other normal pregnancy, first trimester: Secondary | ICD-10-CM

## 2012-09-30 DIAGNOSIS — Z1151 Encounter for screening for human papillomavirus (HPV): Secondary | ICD-10-CM | POA: Insufficient documentation

## 2012-09-30 DIAGNOSIS — Z3480 Encounter for supervision of other normal pregnancy, unspecified trimester: Secondary | ICD-10-CM

## 2012-09-30 DIAGNOSIS — Z01419 Encounter for gynecological examination (general) (routine) without abnormal findings: Secondary | ICD-10-CM | POA: Insufficient documentation

## 2012-09-30 MED ORDER — COMPLETENATE 29-1 MG PO CHEW: 1.0000 | CHEWABLE_TABLET | Freq: Every day | ORAL | Status: DC

## 2012-09-30 NOTE — Progress Notes (Signed)
Cassandra Lee is a 32 y.o. [id number] at [redacted]w[redacted]d here for her first prenatal visit. Her nephew's girlfriend has known pt for 7 years and both feel comfortable with her interpreting.  S: Pt is happy about this pregnancy - Nausea and vomiting 1-2x/day but otherwise feels fine. This is making it hard for her to take PNV. - Denies contractions, vaginal bleeding, or LOF; has thin white discharge with no pain or discomfort. - One painful back tooth - Denies CP, SOB, fever, chills, headache, vision changes, rash, diarrhea, abdominal pain, edema  PMH:  - Anemia with previous pregnancies - Medications: Prenatal vitamin (not taking due to nausea/vomiting) - NKDA - No surgeries or hospitalizations - [id number]; Previous pregnancies all full-term NSVDs with no complications with preg or deliveries at [facility] - Pap smear 2 years ago with last pregnancy, always normal, all at [facility]  FH: Pt does not believe any h/o medical diseases, childhood illness, sudden childhood death, chromosomal abnormalities, or birth defects  SH:  - Pt denies tobacco use/exposure or drug or alcohol use - Denies DV - Lives with husband and 3 children - Feels supported  O:  BP 115/79  Temp(Src) 98.1 F (36.7 C)  Wt 107 lb 12.8 oz (48.898 kg)  BMI 21.76 kg/m2  GEN: NAD, sitting on exam table HEENT: Poor dentition but no obvious signs of infection, abscess, purulence, or erythema CV: RRR, no murmurs, rubs, or gallops PULM: CTAB, no wheezes or crackles, normal effort ABD: Soft, nontender, nondistended, no fundus palpated, no FHT auscultated yet EXTR: No edema SKIN: No rash GU: External genitalia WNL; thin vaginal discharge; cervix appears erythematous but non purulent and no cervical motion tenderness; thick and closed; bimanual exam WNL  A/P: 32 y.o. [id number] at [redacted]w[redacted]d by [redacted]w[redacted]d [place] with low-risk IUP here for first OB visit - Pap smear and GC/chlamydia today - call pt with results - Pt signed release to obtain records  from [facility] Dept; find out when last pap, TDAP, flu shot, varicella, and supplemental details on previous  pregnancies - Recommended prenatal vitamins in early pregnancy - changed rx to gummy and told pt she can also get OTC if she likes different brand - Encouraged seeing dentist who takes Medicaid, frequent small meals - Filled out [facility] (no risk identified) and PHQ-9 (Score 1 (tired/little energy) with these problems making it "Not difficulty at all" to function). - Plans to get [facility] and breast/bottle feed; discuss birth control at f/u - f/u 4 weeks: Ask if wants quad screen (16-18 wks), fill out family interpreter form, schedule anatomy US, go over genetic/inf screen tab, schedule prenatal clinic w/Dr. Mauricio Po, offer cystic fibrosis?

## 2012-09-30 NOTE — Progress Notes (Signed)
Please discuss tubal ligation

## 2012-09-30 NOTE — Patient Instructions (Addendum)
It was good to see you today for your pregnancy!  - We are doing some labs today. I will call you if these are abnormal. - Try chewable prenatal vitamins once daily. Take with meals. Let me know if this prescription does not work or you can't get it over the counter. - Eat smaller meals more frequently. - I will get your records from the health department. - See a dentist if you have not already.  - Return in 4 weeks for your next visit.     Pregnancy, First Trimester The first trimester is the first 3 months your baby is growing inside you. It is important to follow your doctor's instructions. HOME CARE   Do not smoke.  Do not drink alcohol.  Only take medicine as told by your doctor.  Exercise.  Eat healthy foods. Eat regular, well-balanced meals.  You can have sex (intercourse) if there are no other problems with the pregnancy.  Things that help with morning sickness:  Eat soda crackers before getting up in the morning.  Eat 4 to 5 small meals rather than 3 large meals.  Drink liquids between meals, not during meals.  Go to all appointments as told.  Take all vitamins or supplements as told by your doctor. GET HELP RIGHT AWAY IF:   You develop a fever.  You have a bad smelling fluid that is leaking from your vagina.  There is bleeding from the vagina.  You develop severe belly (abdominal) or back pain.  You throw up (vomit) blood. It may look like coffee grounds.  You lose more than 2 pounds in a week.  You gain 5 pounds or more in a week.  You gain more than 2 pounds in a week and you see puffiness (swelling) in your feet, ankles, or legs.  You have severe dizziness or pass out (faint).  You are around people who have [place] measles, chickenpox, or fifth disease.  You have a headache, watery poop (diarrhea), pain with peeing (urinating), or cannot breath right. Document Released: 12/20/2007 Document Revised: 09/25/2011 Document Reviewed:  12/20/2007 [facility] Patient Information 2013 [place], [place].

## 2012-10-08 ENCOUNTER — Encounter: Payer: Self-pay | Admitting: Family Medicine

## 2012-10-28 ENCOUNTER — Ambulatory Visit (INDEPENDENT_AMBULATORY_CARE_PROVIDER_SITE_OTHER): Payer: Self-pay | Admitting: Family Medicine

## 2012-10-28 VITALS — BP 113/76 | Temp 97.9°F | Wt 106.0 lb

## 2012-10-28 DIAGNOSIS — Z3480 Encounter for supervision of other normal pregnancy, unspecified trimester: Secondary | ICD-10-CM

## 2012-10-28 DIAGNOSIS — Z3482 Encounter for supervision of other normal pregnancy, second trimester: Secondary | ICD-10-CM

## 2012-10-28 NOTE — Patient Instructions (Addendum)
It was good to see you today.  The biggest thing from this visit is we found out that your Medicaid is inactive. Please call the medicaid person and work on this. The anatomy ultrasound is scheduled for May 5th - Make sure to make it to this or reschedule if you need to.  Make a dental appointment and let me know if you need my help with a referral.  If you develop any concerning symptoms (bleeding, contractions, gush of fluid, or baby stops moving) go directly to [facility]. If you have any other concerns, you can schedule a sooner appointment.  Otherwise, follow up in 4 weeks with the [facility] clinic with Dr. Mauricio Po.

## 2012-10-28 NOTE — Progress Notes (Signed)
Joliyah Liguori is a 32 y.o. [id number] at [redacted]w[redacted]d here for her routine prenatal visit. Her nephew's girlfriend interprets (and filled out release for interpretation, scanned). S: No complaints today. Denies vaginal bleeding, contractions, or gush of fluid. Has started feeling slight fluttering. Taking prenatal vitamins and tolerating them.  O: See flowsheet; NAD and well-appearing A/P: Pregnancy at [redacted]w[redacted]d progressing well. Pap and GC/Chlamydia done last visit and negative.  - Scheduled OB anatomy US for early May - F/u in 4 weeks with OB clinic. Does not want quad screen. No early GTT warranted in this pt; will perform at 28 weeks. - Need to check [facility] records for vaccination hx (varicella, TDAP, hep B) as pt is not certain. - Encouraged dental appt with poor dentition. - Birth control - still unsure, considering options. Unlikely BTL as wants more children. - In office, when scheduling [place], found that pt's medicaid inactive. Informed pt and she will contact medicaid office to try and reinstate, and is aware of cost of [place] without insurance - Declined genetic/CF screening - Return precautions discussed

## 2012-11-04 ENCOUNTER — Encounter: Payer: Self-pay | Admitting: Family Medicine

## 2012-11-05 ENCOUNTER — Encounter: Payer: Self-pay | Admitting: *Deleted

## 2012-11-18 ENCOUNTER — Ambulatory Visit (HOSPITAL_COMMUNITY): Payer: Self-pay | Attending: Family Medicine

## 2012-11-26 ENCOUNTER — Encounter: Payer: Self-pay | Admitting: Family Medicine

## 2012-12-05 ENCOUNTER — Ambulatory Visit (INDEPENDENT_AMBULATORY_CARE_PROVIDER_SITE_OTHER): Payer: Medicaid Other | Admitting: Family Medicine

## 2012-12-05 ENCOUNTER — Telehealth: Payer: Self-pay | Admitting: Family Medicine

## 2012-12-05 VITALS — BP 112/66 | Temp 99.1°F | Wt 109.7 lb

## 2012-12-05 DIAGNOSIS — Z3480 Encounter for supervision of other normal pregnancy, unspecified trimester: Secondary | ICD-10-CM

## 2012-12-05 DIAGNOSIS — Z3482 Encounter for supervision of other normal pregnancy, second trimester: Secondary | ICD-10-CM

## 2012-12-05 DIAGNOSIS — K0889 Other specified disorders of teeth and supporting structures: Secondary | ICD-10-CM | POA: Insufficient documentation

## 2012-12-05 MED ORDER — COMPLETENATE 29-1 MG PO CHEW: 1.0000 | CHEWABLE_TABLET | Freq: Every day | ORAL | Status: DC

## 2012-12-05 NOTE — Telephone Encounter (Signed)
Spoke with Nurse, learning disability, Research scientist (physical sciences).  Pt. Is pregnant and has a right back tooth that is giving her problems.  She has been seen by the dental clinic years ago.  I will request Dr. Karie Schwalbe to place a referral for the dental clinic for patient.    Translator-[name] [phone]

## 2012-12-05 NOTE — Telephone Encounter (Signed)
Pt needs referral to dental clinic Translator-[name] [phone]

## 2012-12-05 NOTE — Telephone Encounter (Signed)
Covering Dr. Lucienne Minks inbox. Referral for dentristry ordered. Please call pt/interpretor to inform her that there is often a waiting list and it may be several weeks before she can be seen. Thanks! --CMS

## 2012-12-05 NOTE — Progress Notes (Signed)
Patient seen in [facility] with Dr Durene Cal, chart reviewed and I agree with Dr. Erasmo Leventhal assess/plan as documented in his note.  Next follow up with Dr Benjamin Stain in 4 weeks.  Already had Anatomy Scan [place] scheduled in one week. Paula Compton, MD

## 2012-12-05 NOTE — Patient Instructions (Signed)
It was nice to meet you today.   Thanks for setting up your next ultrasound since you got your medicaid back.  Make a dental appointment and let me know if you need my help with a referral. Take prenatal vitamin daily.  We are still awaiting your records-Dr. T will update you at next visit.   Keep considering your birth control options and lookover the handout we gave you.   If you develop any concerning symptoms (bleeding, contractions, gush of fluid, or baby stops moving) go directly to [facility]. If you have any other concerns, you can schedule a sooner appointment.  See Dr. Benjamin Stain in 4 weeks.

## 2012-12-05 NOTE — Assessment & Plan Note (Signed)
See Vitals and Notes. 

## 2012-12-05 NOTE — Progress Notes (Signed)
Cassandra Lee 32 y.o. female  [phone] at [redacted]w[redacted]d presenting for routine follow up in [facility] clinic.  No complaints/questions today.  Reasons to go to [facility] discussed Patient not interested in childbirth, parenting breastfeeding classes. Plans to breast/bottle feed.  Just got medicaid reissued-already has appointment for ultrasound on 5/29. Plans to make appointment for dentist as well (right molar still with some pain).  Birth control-likely condoms, afraid of other options. Seems to be considering nexplanon to some degree. No pills because she would forget she says.  Taking PNV that she bought but wants to try one at [facility] that she used to use.  Discussed weight gain 25-35 lbs.  Case discussed with Dr. Mauricio Po and patient seen with Dr. Mauricio Po.

## 2012-12-06 NOTE — Telephone Encounter (Signed)
Patient must have the orange card to get a referral to the dental clinic.  I don't see where she has an orange card.  Tried to call interpreter but voicemail was not set up.  Ileana Ladd

## 2012-12-06 NOTE — Telephone Encounter (Signed)
Thanks. I'll leave this in [name]'s inbox so she can see and follow up if/when the patient/interpretor calls back. --CMS

## 2012-12-11 ENCOUNTER — Encounter: Payer: Self-pay | Admitting: Family Medicine

## 2012-12-11 ENCOUNTER — Ambulatory Visit (HOSPITAL_COMMUNITY)
Admission: RE | Admit: 2012-12-11 | Discharge: 2012-12-11 | Disposition: A | Discharge status: Home / Self Care | Payer: Medicaid Other | Source: Ambulatory Visit | Attending: Family Medicine | Admitting: Family Medicine

## 2012-12-11 DIAGNOSIS — Z363 Encounter for antenatal screening for malformations: Secondary | ICD-10-CM | POA: Insufficient documentation

## 2012-12-11 DIAGNOSIS — Z1389 Encounter for screening for other disorder: Secondary | ICD-10-CM | POA: Insufficient documentation

## 2012-12-11 DIAGNOSIS — Z3482 Encounter for supervision of other normal pregnancy, second trimester: Secondary | ICD-10-CM

## 2012-12-11 DIAGNOSIS — O358XX0 Maternal care for other (suspected) fetal abnormality and damage, not applicable or unspecified: Secondary | ICD-10-CM | POA: Insufficient documentation

## 2012-12-11 NOTE — Telephone Encounter (Signed)
Can we try patient's interpreter this week to ask this question about if she has tried to get [employer] and to communicate that she needs this for referral to dental clinic? Does her Medicaid not cover this? Thanks.

## 2012-12-12 NOTE — Telephone Encounter (Signed)
Called [name] Insurance claims handler) and got the same message that voicemail is not set up.  Unable to leave a message.  I  her Medicaid is pregnancy medicaid only, so dental is not covered.  Cassandra Lee

## 2012-12-19 NOTE — Telephone Encounter (Signed)
Have we tried interpreter again? Thanks.

## 2012-12-20 ENCOUNTER — Telehealth: Payer: Self-pay | Admitting: Family Medicine

## 2012-12-20 NOTE — Telephone Encounter (Signed)
Called [name] Insurance claims handler) again and still get the message that voicemail is not set up.  At this point, I would just recommend discussing this at her next [facility] appointment scheduled for 01/10/2013.  Ileana Ladd

## 2012-12-20 NOTE — Telephone Encounter (Signed)
I called the [facility].  It is not a Medicaid referral the patient needs.  She needs a letter from her OB to include and answer the following questions:  When and if xrays may be taken because patient is pregnant.  What pain medications and/or antibiotics may or may not be prescribed.  What anesthetics may or may not be used on this patient.  When letter is written is may be faxed, attn: Nicole to [phone].  Tyeesha Riker, Darlyne Russian, CMA

## 2012-12-20 NOTE — Telephone Encounter (Signed)
Patient is needing a referral to dentist.  She is going to [facility]  [phone].  She had medicaid.

## 2012-12-24 ENCOUNTER — Encounter: Payer: Self-pay | Admitting: Family Medicine

## 2012-12-24 NOTE — Telephone Encounter (Signed)
Printing letter and will have it faxed today.  Thank you.  Simone Curia  12/24/2012 2:17 PM

## 2013-01-02 ENCOUNTER — Encounter: Payer: Self-pay | Admitting: Family Medicine

## 2013-01-10 ENCOUNTER — Encounter: Payer: Medicaid Other | Admitting: Family Medicine

## 2013-01-15 NOTE — Telephone Encounter (Addendum)
Patient missed this appointment and no future appt seen. BLUE TEAM, can we call her and find out when she is planning to follow up?  Thanks.  Leona Singleton, MD 01/15/2013 1:01 PM

## 2013-01-15 NOTE — Telephone Encounter (Signed)
This patient does not speak [place]. 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

## 2013-01-15 NOTE — Telephone Encounter (Signed)
To [employer]. Fleeger, Maryjo Rochester

## 2013-01-16 NOTE — Telephone Encounter (Signed)
Was able to reach [facility].   She was agreeable to July 22 (1st available with Dr. Benjamin Stain.)  There are no other appts with Dr. Karie Schwalbe and pt has already seen [facility] clinic.

## 2013-01-21 NOTE — Telephone Encounter (Signed)
Hello my friend I do not speak chinese :(   [employer]

## 2013-02-04 ENCOUNTER — Ambulatory Visit (INDEPENDENT_AMBULATORY_CARE_PROVIDER_SITE_OTHER): Payer: Medicaid Other | Admitting: Family Medicine

## 2013-02-04 VITALS — BP 115/67 | Temp 98.1°F | Wt 115.0 lb

## 2013-02-04 DIAGNOSIS — Z3483 Encounter for supervision of other normal pregnancy, third trimester: Secondary | ICD-10-CM

## 2013-02-04 DIAGNOSIS — Z3480 Encounter for supervision of other normal pregnancy, unspecified trimester: Secondary | ICD-10-CM

## 2013-02-04 LAB — CBC
HCT: 33.9 % — ABNORMAL LOW (ref 36.0–46.0)
MCV: 69.8 fL — ABNORMAL LOW (ref 78.0–100.0)
Platelets: 372 10*3/uL (ref 150–400)
RBC: 4.86 MIL/uL (ref 3.87–5.11)
WBC: 9.7 10*3/uL (ref 4.0–10.5)

## 2013-02-04 LAB — GLUCOSE, CAPILLARY
Comment 1: 1
Glucose-Capillary: 81 mg/dL (ref 70–99)

## 2013-02-04 NOTE — Progress Notes (Signed)
32 y.o. [id number] at [redacted]w[redacted]d here for routine prenatal visit, complicated by missing 24 week visit. Her nephew's girlfriend interprets. S: Denies vaginal bleeding, gush of fluid, abnormal discharge, contractions, or slowing of fetal movement. O: See flowsheet. NAD. Of note, fundus measuring 24cm. Fetal HR reassuring. A/P: Pregnancy at [redacted]w[redacted]d measuring 24cm, rechecked by Dr. Randolm Idol. Reassuring FHT and 6 lb wt gain; per mother, previous children were small during pregnancy and she has been through this before.  -Ordered US for growth and nurses to schedule prior to pt leaving clinic. If SGA, will refer to MFM. -1hr GTT, CBC, RPR, HIV today -With size measuring less than dates, f/u in 2 weeks. At that time, re-fill out pregnancy medical home form. - Need to check [facility] records for vaccination hx (varicella, TDAP, hep B) as pt is not certain. -Medicaid was reissued and pt saw dentist, with extraction of 1 tooth planned early next week. -Plans to breast and bottle feed. Scared of nexplanon and will plan to use condoms. -Continuing prenatal vitamins.

## 2013-02-04 NOTE — Patient Instructions (Addendum)
Baby has a good heart rate but is measuring small. - We are ordering an ultrasound for growth and my nurses will schedule this for you before you leave. - We are getting labs today and I will call you if anything is NOT normal. Please call me if you do not hear anything in 1-2 weeks.  Please follow up with me after the ultrasound, in around 2 weeks.  Please seek immediate care at [facility] if you develop vaginal bleeding, contractions, gush of clear fluid, or baby stops moving.   Fetal Movement Counts Patient Name: __________________________________________________ Patient Due Date: ____________________ Performing a fetal movement count is highly recommended in high-risk pregnancies, but it is good for every pregnant woman to do. Your caregiver may ask you to start counting fetal movements at 28 weeks of the pregnancy. Fetal movements often increase:  After eating a full meal.  After physical activity.  After eating or drinking something sweet or cold.  At rest. Pay attention to when you feel the baby is most active. This will help you notice a pattern of your baby's sleep and wake cycles and what factors contribute to an increase in fetal movement. It is important to perform a fetal movement count at the same time each day when your baby is normally most active.  HOW TO COUNT FETAL MOVEMENTS 1. Find a quiet and comfortable area to sit or lie down on your left side. Lying on your left side provides the best blood and oxygen circulation to your baby. 2. Write down the day and time on a sheet of paper or in a journal. 3. Start counting kicks, flutters, swishes, rolls, or jabs in a 2 hour period. You should feel at least 10 movements within 2 hours. 4. If you do not feel 10 movements in 2 hours, wait 2 3 hours and count again. Look for a change in the pattern or not enough counts in 2 hours. SEEK MEDICAL CARE IF:  You feel less than 10 counts in 2 hours, tried twice.  There is no  movement in over an hour.  The pattern is changing or taking longer each day to reach 10 counts in 2 hours.  You feel the baby is not moving as he or she usually does. Date: ____________ Movements: ____________ Start time: ____________ Doreatha Martin time: ____________

## 2013-02-05 ENCOUNTER — Encounter: Payer: Self-pay | Admitting: Family Medicine

## 2013-02-06 ENCOUNTER — Other Ambulatory Visit: Payer: Self-pay | Admitting: Family Medicine

## 2013-02-06 ENCOUNTER — Ambulatory Visit (HOSPITAL_COMMUNITY)
Admission: RE | Admit: 2013-02-06 | Discharge: 2013-02-06 | Disposition: A | Discharge status: Home / Self Care | Payer: Medicaid Other | Source: Ambulatory Visit | Attending: Family Medicine | Admitting: Family Medicine

## 2013-02-06 ENCOUNTER — Encounter: Payer: Self-pay | Admitting: Family Medicine

## 2013-02-06 DIAGNOSIS — O365990 Maternal care for other known or suspected poor fetal growth, unspecified trimester, not applicable or unspecified: Secondary | ICD-10-CM | POA: Insufficient documentation

## 2013-02-06 DIAGNOSIS — D279 Benign neoplasm of unspecified ovary: Secondary | ICD-10-CM | POA: Insufficient documentation

## 2013-02-06 DIAGNOSIS — Z3483 Encounter for supervision of other normal pregnancy, third trimester: Secondary | ICD-10-CM

## 2013-02-06 DIAGNOSIS — O34599 Maternal care for other abnormalities of gravid uterus, unspecified trimester: Secondary | ICD-10-CM | POA: Insufficient documentation

## 2013-02-19 ENCOUNTER — Ambulatory Visit (INDEPENDENT_AMBULATORY_CARE_PROVIDER_SITE_OTHER): Payer: Medicaid Other | Admitting: Family Medicine

## 2013-02-19 VITALS — BP 109/70 | Temp 98.0°F | Wt 114.0 lb

## 2013-02-19 DIAGNOSIS — D369 Benign neoplasm, unspecified site: Secondary | ICD-10-CM

## 2013-02-19 DIAGNOSIS — Z3483 Encounter for supervision of other normal pregnancy, third trimester: Secondary | ICD-10-CM

## 2013-02-19 DIAGNOSIS — Z23 Encounter for immunization: Secondary | ICD-10-CM

## 2013-02-19 DIAGNOSIS — Z3480 Encounter for supervision of other normal pregnancy, unspecified trimester: Secondary | ICD-10-CM

## 2013-02-19 MED ORDER — TETANUS-DIPHTH-ACELL PERTUSSIS 5-2.5-18.5 LF-MCG/0.5 IM SUSP: 0.5000 mL | Freq: Once | INTRAMUSCULAR | Status: DC

## 2013-02-19 NOTE — Progress Notes (Addendum)
32 y.o. Z6X0960 at [redacted]w[redacted]d here for prenatal visit.  S: BP weight all reviewed and noted. Patient reports good fetal movement, denies any bleeding and no rupture of membranes symptoms or regular contractions. Patient is without complaints. 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  - Weight loss of 1 lb: recommended eating breakfast and small meals throughout the day - TDAP today, pcmh form with no issues today - Dermoid cyst seen on 01/2013 [place]: 11/2012 [place] did not show but 08/2012 US showed one. Discussed with OB fellow Dr. Reola Calkins and Dr. Debroah Loop - f/u in [facility] gyn clinic postpartum, consider repeat transvaginal pelvic ultrasound after delivery for f/u of ovarian cysts, to further characterize and make decision about surgery at that time. Pt made aware through interpreter Herbert Seta (family member). All questions were answered.  - I will be pediatrician.

## 2013-02-19 NOTE — Assessment & Plan Note (Signed)
See flowsheet for details.

## 2013-02-19 NOTE — Assessment & Plan Note (Addendum)
See flowsheet for details. - Transvaginal ultrasound repeated within 6 weeks postpartum  - consider surgery at that time. - Discussed with OB fellow Dr. Reola Calkins

## 2013-02-19 NOTE — Patient Instructions (Addendum)
We are doing a TDAP shot today. I will call you about the ultrasound cysts. Eat a small meal at breakfast time and small meals throughout the day.  Fetal Movement Counts Patient Name: __________________________________________________ Patient Due Date: ____________________ Performing a fetal movement count is highly recommended in high-risk pregnancies, but it is good for every pregnant woman to do. Your caregiver may ask you to start counting fetal movements at 28 weeks of the pregnancy. Fetal movements often increase:  After eating a full meal.  After physical activity.  After eating or drinking something sweet or cold.  At rest. Pay attention to when you feel the baby is most active. This will help you notice a pattern of your baby's sleep and wake cycles and what factors contribute to an increase in fetal movement. It is important to perform a fetal movement count at the same time each day when your baby is normally most active.  HOW TO COUNT FETAL MOVEMENTS 1. Find a quiet and comfortable area to sit or lie down on your left side. Lying on your left side provides the best blood and oxygen circulation to your baby. 2. Write down the day and time on a sheet of paper or in a journal. 3. Start counting kicks, flutters, swishes, rolls, or jabs in a 2 hour period. You should feel at least 10 movements within 2 hours. 4. If you do not feel 10 movements in 2 hours, wait 2 3 hours and count again. Look for a change in the pattern or not enough counts in 2 hours. SEEK MEDICAL CARE IF:  You feel less than 10 counts in 2 hours, tried twice.  There is no movement in over an hour.  The pattern is changing or taking longer each day to reach 10 counts in 2 hours.  You feel the baby is not moving as he or she usually does.  Date: ____________ Movements: ____________ Start time: ____________ Doreatha Martin time: ____________ Date: ____________ Movements: ____________ Start time: ____________ Doreatha Martin time:  ____________ Date: ____________ Movements: ____________ Start time: ____________ Doreatha Martin time: ____________ Date: ____________ Movements: ____________ Start time: ____________ Doreatha Martin time: ____________  Date: ____________ Movements: ____________ Start time: ____________ Doreatha Martin time: ____________ Date: ____________ Movements: ____________ Start time: ____________ Doreatha Martin time: ____________ Date: ____________ Movements: ____________ Start time: ____________ Doreatha Martin time: ____________ Date: ____________ Movements: ____________ Start time: ____________ Doreatha Martin time: ____________ Date: ____________ Movements: ____________ Start time: ____________ Doreatha Martin time: ____________  ____________ Date: ____________ Movements: ____________ Start time: ____________ Doreatha Martin time: ____________ Date: ____________ Movements: ____________ Start time: ____________ Doreatha Martin time: ____________ Date: ____________ Movements: ____________ Start time: ____________ Doreatha Martin time: ____________ Date: ____________ Movements: ____________ Start time: ____________ Doreatha Martin time: ____________  Date: ____________ Movements: ____________ Start time: ____________ Doreatha Martin time: ____________ Date: ____________ Movements: ____________ Start time: ____________ Doreatha Martin time: ____________ Date: ____________ Movements: ____________ Start time: ____________ Doreatha Martin time: ____________ Date: ____________ Movements: ____________ Start time: ____________ Doreatha Martin time: ____________ Date: ____________ Movements: ____________ Start time: ____________ Doreatha Martin time: ____________ Date: ____________ Movements: ____________ Start time: ____________ Doreatha Martin time: ____________ Date: ____________ Movements: ____________ Start time: ____________ Doreatha Martin time: ____________  Date: ____________ Movements: ____________ Start time: ____________ Doreatha Martin time: ____________ Date: ____________ Movements: ____________ Start time: ____________ Doreatha Martin time: ____________ Date:  ____________ Movements: ____________ Start time: ____________ Doreatha Martin time: ____________ Date: ____________ Movements: ____________ Start time: ____________ Doreatha Martin time: ____________ Date: ____________ Movements: ____________ Start time: ____________ Doreatha Martin time: ____________ Date: ____________ Movements: ____________ Start time: ____________ Doreatha Martin time: ____________

## 2013-03-27 ENCOUNTER — Ambulatory Visit (INDEPENDENT_AMBULATORY_CARE_PROVIDER_SITE_OTHER): Payer: Medicaid Other | Admitting: Family Medicine

## 2013-03-27 VITALS — BP 111/72 | Wt 120.9 lb

## 2013-03-27 DIAGNOSIS — Z3480 Encounter for supervision of other normal pregnancy, unspecified trimester: Secondary | ICD-10-CM

## 2013-03-27 DIAGNOSIS — Z23 Encounter for immunization: Secondary | ICD-10-CM

## 2013-03-27 DIAGNOSIS — Z3483 Encounter for supervision of other normal pregnancy, third trimester: Secondary | ICD-10-CM

## 2013-03-27 NOTE — Progress Notes (Signed)
Patient seen in [facility], [place] language interpreter present throughout visit.  Patient a G5P3  @[redacted]w[redacted]d  today, with EDD 04/18/2013.  She has no complaints. Feels active FM, no LOF, no vaginal bleeding or discharge.  No ctx.  Plans to breast and bottle feed her infant, which is how she fed her previous infants. All previous pregnancies were term NSVDs. Exam: Leopolds maneuvers confirm vertex fetal lie. GBS rectovaginal swab collected today.  She is given a flu shot today as well.  Plan for routine prenatal visit in 1 week with Dr Benjamin Stain.  Discussed signs of labor; she is term as of tomorrow.  Discussed kick counts and reasons to call/come back or go to [facility] sooner.  Paula Compton, MD

## 2013-03-27 NOTE — Patient Instructions (Addendum)
It was a pleasure to see you today in OB Clinic in the [facility].  You are 36 weeks and 6 days along in your pregnancy.   I am glad you have chosen Dr Benjamin Stain to be the baby's doctor, and that you will be breast- and bottle-feeding your infant.   Today we did the GBS culture and gave you a flu shot.   Your next visit is in 1 week with Dr Benjamin Stain.  FOLLOW UP PRENATAL VISIT WITH DR Simone Curia IN 1 WEEK, THEN WEEKLY UNTIL DELIVERY.  Fetal Movement Counts Patient Name: __________________________________________________ Patient Due Date: ____________________ Performing a fetal movement count is highly recommended in high-risk pregnancies, but it is good for every pregnant woman to do. Your caregiver may ask you to start counting fetal movements at 28 weeks of the pregnancy. Fetal movements often increase:  After eating a full meal.  After physical activity.  After eating or drinking something sweet or cold.  At rest. Pay attention to when you feel the baby is most active. This will help you notice a pattern of your baby's sleep and wake cycles and what factors contribute to an increase in fetal movement. It is important to perform a fetal movement count at the same time each day when your baby is normally most active.  HOW TO COUNT FETAL MOVEMENTS 1. Find a quiet and comfortable area to sit or lie down on your left side. Lying on your left side provides the best blood and oxygen circulation to your baby. 2. Write down the day and time on a sheet of paper or in a journal. 3. Start counting kicks, flutters, swishes, rolls, or jabs in a 2 hour period. You should feel at least 10 movements within 2 hours. 4. If you do not feel 10 movements in 2 hours, wait 2 3 hours and count again. Look for a change in the pattern or not enough counts in 2 hours. SEEK MEDICAL CARE IF:  You feel less than 10 counts in 2 hours, tried twice.  There is no movement in over an  hour.  The pattern is changing or taking longer each day to reach 10 counts in 2 hours.  You feel the baby is not moving as he or she usually does. Date: ____________ Movements: ____________ Start time: ____________ Doreatha Martin time: ____________

## 2013-04-02 ENCOUNTER — Ambulatory Visit (INDEPENDENT_AMBULATORY_CARE_PROVIDER_SITE_OTHER): Payer: Medicaid Other | Admitting: Family Medicine

## 2013-04-02 ENCOUNTER — Other Ambulatory Visit (HOSPITAL_COMMUNITY)
Admission: RE | Admit: 2013-04-02 | Discharge: 2013-04-02 | Disposition: A | Discharge status: Home / Self Care | Payer: Medicaid Other | Source: Ambulatory Visit | Attending: Family Medicine | Admitting: Family Medicine

## 2013-04-02 VITALS — BP 110/70 | Temp 98.5°F | Wt 121.0 lb

## 2013-04-02 DIAGNOSIS — Z331 Pregnant state, incidental: Secondary | ICD-10-CM

## 2013-04-02 DIAGNOSIS — Z3483 Encounter for supervision of other normal pregnancy, third trimester: Secondary | ICD-10-CM

## 2013-04-02 DIAGNOSIS — Z3480 Encounter for supervision of other normal pregnancy, unspecified trimester: Secondary | ICD-10-CM

## 2013-04-02 DIAGNOSIS — Z113 Encounter for screening for infections with a predominantly sexual mode of transmission: Secondary | ICD-10-CM | POA: Insufficient documentation

## 2013-04-02 NOTE — Patient Instructions (Addendum)
Come back in 1 week for next check. If you have bleeding, gush of fluid like your water's broken, baby stops moving, or you have strong contractions, seek immediate care at [facility].  Prenatal Care  WHAT IS PRENATAL CARE?  Prenatal care means health care during your pregnancy, before your baby is born. Take care of yourself and your baby by:   Getting early prenatal care. If you know you are pregnant, or think you might be pregnant, call your caregiver as soon as possible. Schedule a visit for a general/prenatal examination.  Getting regular prenatal care. Follow your caregiver's schedule for blood and other necessary tests. Do not miss appointments.  Do everything you can to keep yourself and your baby healthy during your pregnancy.  Prenatal care should include evaluation of medical, dietary, educational, psychological, and social needs for the couple and the medical, surgical, and genetic history of the family of the mother and father.  Discuss with your caregiver:  Your medicines, prescription, over-the-counter, and herbal medicines.  Substance abuse, alcohol, smoking, and illegal drugs.  Domestic abuse and violence, if present.  Your immunizations.  Nutrition and diet.  Exercising.  Environment and occupational hazards, at home and at work.  History of sexually transmitted disease, both you and your partner.  Previous pregnancies. WHY IS PRENATAL CARE SO IMPORTANT?  By seeing you regularly, your caregiver has the chance to find problems early, so that they can be treated as soon as possible. Other problems might be prevented. Many studies have shown that early and regular prenatal care is important for the health of both mothers and their babies.  I AM THINKING ABOUT GETTING PREGNANT. HOW CAN I TAKE CARE OF MYSELF?  Taking care of yourself before you get pregnant helps you to have a healthy pregnancy. It also lowers your chances of having a baby born with a birth  defect. Here are ways to take care of yourself before you get pregnant:   Eat healthy foods, exercise regularly (30 minutes per day for most days of the week is best), and get enough rest and sleep. Talk to your caregiver about what kinds of foods and exercises are best for you.  Take 400 micrograms (mcg) of folic acid (one of the B vitamins) every day. The best way to do this is to take a daily multivitamin pill that contains this amount of folic acid. Getting enough of the synthetic (manufactured) form of folic acid every day before you get pregnant and during early pregnancy can help prevent certain birth defects. Many breakfast cereals and other grain products have folic acid added to them, but only certain cereals contain 400 mcg of folic acid per serving. Check the label on your multivitamin or cereal to find the amount of folic acid in the food.  See your caregiver for a complete check up before getting pregnant. Make sure that you have had all your immunization shots, especially for rubella ([place] measles). Rubella can cause serious birth defects. Chickenpox is another illness you want to avoid during pregnancy. If you have had chickenpox and rubella in the past, you should be immune to them.  Tell your caregiver about any prescription or non-prescription medicines (including herbal remedies) you are taking. Some medicines are not safe to take during pregnancy.  Stop smoking cigarettes, drinking alcohol, or taking illegal drugs. Ask your caregiver for help, if you need it. You can also get help with alcohol and drugs by talking with a member of your faith community,  a Veterinary surgeon, or a trusted friend.  Discuss and treat any medical, social, or psychological problems before getting pregnant.  Discuss any history of genetic problems in the mother, father, and their families. Do genetic testing before getting pregnant, when possible.  Discuss any physical or emotional abuse with your  caregiver.  Discuss with your caregiver if you might be exposed to harmful chemicals on your job or where you live.  Discuss with your caregiver if you think your job or the hours you work may be harmful and should be changed.  The father should be involved with the decision making and with all aspects of the pregnancy, labor, and delivery.  If you have medical insurance, make sure you are covered for pregnancy. I JUST FOUND OUT THAT I AM PREGNANT. HOW CAN I TAKE CARE OF MYSELF?  Here are ways to take care of yourself and the precious new life growing inside you:   Continue taking your multivitamin with 400 micrograms (mcg) of folic acid every day.  Get early and regular prenatal care. It does not matter if this is your first pregnancy or if you already have children. It is very important to see a caregiver during your pregnancy. Your caregiver will check at each visit to make sure that you and the baby are healthy. If there are any problems, action can be taken right away to help you and the baby.  Eat a healthy diet that includes:  Fruits.  Vegetables.  Foods low in saturated fat.  Grains.  Calcium-rich foods.  Drink 6 to 8 glasses of liquids a day.  Unless your caregiver tells you not to, try to be physically active for 30 minutes, most days of the week. If you are pressed for time, you can get your activity in through 10 minute segments, three times a day.  If you smoke, drink alcohol, or use drugs, STOP. These can cause long-term damage to your baby. Talk with your caregiver about steps to take to stop smoking. Talk with a member of your faith community, a counselor, a trusted friend, or your caregiver if you are concerned about your alcohol or drug use.  Ask your caregiver before taking any medicine, even over-the-counter medicines. Some medicines are not safe to take during pregnancy.  Get plenty of rest and sleep.  Avoid hot tubs and saunas during pregnancy.  Do not  have X-rays taken, unless absolutely necessary and with the recommendation of your caregiver. A lead shield can be placed on your abdomen, to protect the baby when X-rays are taken in other parts of the body.  Do not empty the cat litter when you are pregnant. It may contain a parasite that causes an infection called toxoplasmosis, which can cause birth defects. Also, use gloves when working in garden areas used by cats.  Do not eat uncooked or undercooked cheese, meats, or fish.  Stay away from toxic chemicals like:  Insecticides.  Solvents (some cleaners or paint thinners).  Lead.  Mercury.  Sexual relations may continue until the end of the pregnancy, unless you have a medical problem or there is a problem with the pregnancy and your caregiver tells you not to.  Do not wear high heel shoes, especially during the second half of the pregnancy. You can lose your balance and fall.  Do not take long trips, unless absolutely necessary. Be sure to see your caregiver before going on the trip.  Do not sit in one position for more than 2 hours,  when on a trip.  Take a copy of your medical records when going on a trip.  Know where there is a hospital in the city you are visiting, in case of an emergency.  Most dangerous household products will have pregnancy warnings on their labels. Ask your caregiver about products if you are unsure.  Limit or eliminate your caffeine intake from coffee, tea, sodas, medicines, and chocolate.  Many women continue working through pregnancy. Staying active might help you stay healthier. If you have a question about the safety or the hours you work at your particular job, talk with your caregiver.  Get informed:  Read books.  Watch videos.  Go to childbirth classes for you and the father.  Talk with experienced moms.  Ask your caregiver about childbirth education classes for you and your partner. Classes can help you and your partner prepare for the  birth of your baby.  Ask about a pediatrician (baby doctor) and methods and pain medicine for labor, delivery, and possible Cesarean delivery (C-section). I AM NOT THINKING ABOUT GETTING PREGNANT RIGHT NOW, BUT HEARD THAT ALL WOMEN SHOULD TAKE FOLIC ACID EVERY DAY?  All women of childbearing age, with even a remote chance of getting pregnant, should try to make sure they get enough folic acid. Many pregnancies are not planned. Many women do not know they are actually pregnant early in their pregnancies, and certain birth defects happen in the very early part of pregnancy. Taking 400 micrograms (mcg) of folic acid every day will help prevent certain birth defects that happen in the early part of pregnancy. If a woman begins taking vitamin pills in the second or third month of pregnancy, it may be too late to prevent birth defects. Folic acid may also have other health benefits for women, besides preventing birth defects.  HOW OFTEN SHOULD I SEE MY CAREGIVER DURING PREGNANCY?  Your caregiver will give you a schedule for your prenatal visits. You will have visits more often as you get closer to the end of your pregnancy. An average pregnancy lasts about 40 weeks.  A typical schedule includes visiting your caregiver:   About once each month, during your first 6 months of pregnancy.  Every 2 weeks, during the next 2 months.  Weekly in the last month, until the delivery date. Your caregiver will probably want to see you more often if:  You are over 35.  Your pregnancy is high risk, because you have certain health problems or problems with the pregnancy, such as:  Diabetes.  High blood pressure.  The baby is not growing on schedule, according to the dates of the pregnancy. Your caregiver will do special tests, to make sure you and the baby are not having any serious problems. WHAT HAPPENS DURING PRENATAL VISITS?   At your first prenatal visit, your caregiver will talk to you about you and your  partner's health history and your family's health history, and will do a physical exam.  On your first visit, a physical exam will include checks of your blood pressure, height and weight, and an exam of your pelvic organs. Your caregiver will do a Pap test if you have not had one recently, and will do cultures of your cervix to make sure there is no infection.  At each visit, there will be tests of your blood, urine, blood pressure, weight, and checking the progress of the baby.  Your caregiver will be able to tell you when to expect that your baby will  be born.  Each visit is also a chance for you to learn about staying healthy during pregnancy and for asking questions.  Discuss whether you will be breastfeeding.  At your later prenatal visits, your caregiver will check how you are doing and how the baby is developing. You may have a number of tests done as your pregnancy progresses.  Ultrasound exams are often used to check on the baby's growth and health.  You may have more urine and blood tests, as well as special tests, if needed. These may include amniocentesis (examine fluid in the pregnancy sac), stress tests (check how baby responds to contractions), biophysical profile (measures fetus well-being). Your caregiver will explain the tests and why they are necessary. I AM IN MY LATE THIRTIES, AND I WANT TO HAVE A CHILD NOW. SHOULD I DO ANYTHING SPECIAL?  As you get older, there is more chance of having a medical problem (high blood pressure), pregnancy problem (preeclampsia, problems with the placenta), miscarriage, or a baby born with a birth defect. However, most women in their late thirties and early forties have healthy babies. See your caregiver on a regular basis before you get pregnant and be sure to go for exams throughout your pregnancy. Your caregiver probably will want to do some special tests to check on you and your baby's health when you are pregnant.  Women today are often  delaying having children until later in life, when they are in their thirties and forties. While many women in their thirties and forties have no difficulty getting pregnant, fertility does decline with age. For women over 40 who cannot get pregnant after 6 months of trying, it is recommended that they see their caregiver for a fertility evaluation. It is not uncommon to have trouble becoming pregnant or experience infertility (inability to become pregnant after trying for one year). If you think that you or your partner may be infertile, you can discuss this with your caregiver. He or she can recommend treatments such as drugs, surgery, or assisted reproductive technology.  Document Released: 07/06/2003 Document Revised: 09/25/2011 Document Reviewed: 06/02/2009 [facility] Patient Information 2014 [place], [place].

## 2013-04-02 NOTE — Assessment & Plan Note (Signed)
See flowsheet for plan

## 2013-04-02 NOTE — Progress Notes (Signed)
GC/Chlamydia tested today Vertex lie with leopold's Follow up 1 week Dermoid cyst - f/u with [facility] outpatient BP and weight results all reviewed and noted. Patient reports good fetal movement, denies any bleeding and no rupture of membranes symptoms or regular contractions. Patient is without complaints. All questions were answered.

## 2013-04-03 ENCOUNTER — Encounter: Payer: Self-pay | Admitting: Family Medicine

## 2013-04-04 ENCOUNTER — Encounter: Payer: Self-pay | Admitting: Family Medicine

## 2013-04-09 ENCOUNTER — Ambulatory Visit (INDEPENDENT_AMBULATORY_CARE_PROVIDER_SITE_OTHER): Payer: Medicaid Other | Admitting: Family Medicine

## 2013-04-09 VITALS — BP 104/68 | Wt 122.0 lb

## 2013-04-09 DIAGNOSIS — Z3480 Encounter for supervision of other normal pregnancy, unspecified trimester: Secondary | ICD-10-CM

## 2013-04-09 DIAGNOSIS — Z3483 Encounter for supervision of other normal pregnancy, third trimester: Secondary | ICD-10-CM

## 2013-04-09 NOTE — Patient Instructions (Addendum)
Come back in 1 week for the next checkup.  Fetal Movement Counts Patient Name: __________________________________________________ Patient Due Date: ____________________ Performing a fetal movement count is highly recommended in high-risk pregnancies, but it is good for every pregnant woman to do. Your caregiver may ask you to start counting fetal movements at 28 weeks of the pregnancy. Fetal movements often increase:  After eating a full meal.  After physical activity.  After eating or drinking something sweet or cold.  At rest. Pay attention to when you feel the baby is most active. This will help you notice a pattern of your baby's sleep and wake cycles and what factors contribute to an increase in fetal movement. It is important to perform a fetal movement count at the same time each day when your baby is normally most active.  HOW TO COUNT FETAL MOVEMENTS 1. Find a quiet and comfortable area to sit or lie down on your left side. Lying on your left side provides the best blood and oxygen circulation to your baby. 2. Write down the day and time on a sheet of paper or in a journal. 3. Start counting kicks, flutters, swishes, rolls, or jabs in a 2 hour period. You should feel at least 10 movements within 2 hours. 4. If you do not feel 10 movements in 2 hours, wait 2 3 hours and count again. Look for a change in the pattern or not enough counts in 2 hours. SEEK MEDICAL CARE IF:  You feel less than 10 counts in 2 hours, tried twice.  There is no movement in over an hour.  The pattern is changing or taking longer each day to reach 10 counts in 2 hours.  You feel the baby is not moving as he or she usually does. Date: ____________ Movements: ____________ Start time: ____________ Doreatha Martin time: ____________

## 2013-04-09 NOTE — Progress Notes (Signed)
Vertex lie with leopolds Follow up 1 week Dermoid cyst - f/u with [facility] outpatient BP and weight results all reviewed and noted. Patient reports good fetal movement, denies any bleeding and no rupture of membranes symptoms or regular contractions. Patient is without complaints. All questions were answered.

## 2013-04-10 NOTE — Assessment & Plan Note (Signed)
See flowsheet for details.

## 2013-04-16 ENCOUNTER — Ambulatory Visit (INDEPENDENT_AMBULATORY_CARE_PROVIDER_SITE_OTHER): Payer: Medicaid Other | Admitting: Family Medicine

## 2013-04-16 VITALS — BP 119/77 | Temp 98.3°F | Wt 123.0 lb

## 2013-04-16 DIAGNOSIS — Z3483 Encounter for supervision of other normal pregnancy, third trimester: Secondary | ICD-10-CM

## 2013-04-16 DIAGNOSIS — Z3480 Encounter for supervision of other normal pregnancy, unspecified trimester: Secondary | ICD-10-CM

## 2013-04-16 NOTE — Progress Notes (Signed)
Melia Dalia is a 32 y.o. [id number] at [redacted]w[redacted]d who presents for routine follow up.  She report that she is feeling well. She has no complaints at this time. She denies contractions, Loss of fluid, vaginal bleeding.  She reports good fetal movement.    See flow sheet for details.  A/P: Pregnancy at [redacted]w[redacted]d.  Doing well.    Infant feeding choice - Breast  Contraception choice - Condoms Infant circumcision desired not applicable Labor precautions reviewed. Patient to be schedule for NST and BPP on Friday ([redacted] weeks gestation). Patient to follow up with PCP next week if she does not go into labor.

## 2013-04-16 NOTE — Patient Instructions (Addendum)
It was nice seeing you today.  You're doing great.  Please follow up next week with Dr. Karie Schwalbe.  I am scheduling an Ultrasound for you this Friday as you will be 40 weeks then.

## 2013-04-17 ENCOUNTER — Telehealth: Payer: Self-pay | Admitting: Family Medicine

## 2013-04-17 ENCOUNTER — Encounter: Payer: Self-pay | Admitting: *Deleted

## 2013-04-17 NOTE — Progress Notes (Signed)
Called pt w/[employer] interpreter # [phone].  I informed her that she does not need appt as scheduled on 10/3 @ 0800 because that is her due date. She will not need to begin fetal testing until after the due date. Her appt was changed to 10/8 @ 0830.  She endorses good FM daily and was instructed to come to hospital for decreased FM or sx of labor. She voiced understanding of all information and instructions.

## 2013-04-17 NOTE — Telephone Encounter (Signed)
Nurse from [facility] calls and states pt is sched for NST tom'r which is her due date, they do not want to do this until the pt is post dates, so she rescheduled pt to next Wed Oct 8. She agreed to call pt.

## 2013-04-18 ENCOUNTER — Other Ambulatory Visit: Payer: Medicaid Other

## 2013-04-20 ENCOUNTER — Inpatient Hospital Stay (HOSPITAL_COMMUNITY)
Admission: AD | Admit: 2013-04-20 | Discharge: 2013-04-22 | DRG: 775 | Disposition: A | Discharge status: Home / Self Care | Payer: Medicaid Other | Source: Ambulatory Visit | Attending: Obstetrics & Gynecology | Admitting: Obstetrics & Gynecology

## 2013-04-20 ENCOUNTER — Encounter (HOSPITAL_COMMUNITY): Payer: Self-pay | Admitting: *Deleted

## 2013-04-20 DIAGNOSIS — D279 Benign neoplasm of unspecified ovary: Secondary | ICD-10-CM | POA: Diagnosis present

## 2013-04-20 DIAGNOSIS — O480 Post-term pregnancy: Principal | ICD-10-CM | POA: Diagnosis present

## 2013-04-20 DIAGNOSIS — O34599 Maternal care for other abnormalities of gravid uterus, unspecified trimester: Secondary | ICD-10-CM

## 2013-04-20 DIAGNOSIS — IMO0001 Reserved for inherently not codable concepts without codable children: Secondary | ICD-10-CM

## 2013-04-20 DIAGNOSIS — D509 Iron deficiency anemia, unspecified: Secondary | ICD-10-CM

## 2013-04-20 LAB — CBC
Hemoglobin: 11.1 g/dL — ABNORMAL LOW (ref 12.0–15.0)
MCH: 22 pg — ABNORMAL LOW (ref 26.0–34.0)
MCHC: 31.4 g/dL (ref 30.0–36.0)
Platelets: 377 10*3/uL (ref 150–400)
WBC: 11.7 10*3/uL — ABNORMAL HIGH (ref 4.0–10.5)

## 2013-04-20 LAB — TYPE AND SCREEN: ABO/RH(D): O POS

## 2013-04-20 MED ORDER — IBUPROFEN 600 MG PO TABS
600.0000 mg | ORAL_TABLET | Freq: Four times a day (QID) | ORAL | Status: DC | PRN
  Administered 2013-04-20: 600 mg via ORAL
  Filled 2013-04-20 (×2): qty 1

## 2013-04-20 MED ORDER — ONDANSETRON HCL 4 MG PO TABS: 4.0000 mg | ORAL_TABLET | ORAL | Status: DC | PRN

## 2013-04-20 MED ORDER — OXYTOCIN 40 UNITS IN LACTATED RINGERS INFUSION - SIMPLE MED
62.5000 mL/h | INTRAVENOUS | Status: DC
  Filled 2013-04-20: qty 1000

## 2013-04-20 MED ORDER — ACETAMINOPHEN 325 MG PO TABS: 650.0000 mg | ORAL_TABLET | ORAL | Status: DC | PRN

## 2013-04-20 MED ORDER — WITCH HAZEL-GLYCERIN EX PADS: 1.0000 "application " | MEDICATED_PAD | CUTANEOUS | Status: DC | PRN

## 2013-04-20 MED ORDER — IBUPROFEN 600 MG PO TABS
600.0000 mg | ORAL_TABLET | Freq: Four times a day (QID) | ORAL | Status: DC
  Administered 2013-04-21 – 2013-04-22 (×6): 600 mg via ORAL
  Filled 2013-04-20 (×6): qty 1

## 2013-04-20 MED ORDER — SIMETHICONE 80 MG PO CHEW: 80.0000 mg | CHEWABLE_TABLET | ORAL | Status: DC | PRN

## 2013-04-20 MED ORDER — LACTATED RINGERS IV SOLN: 500.0000 mL | INTRAVENOUS | Status: DC | PRN

## 2013-04-20 MED ORDER — ZOLPIDEM TARTRATE 5 MG PO TABS: 5.0000 mg | ORAL_TABLET | Freq: Every evening | ORAL | Status: DC | PRN

## 2013-04-20 MED ORDER — LACTATED RINGERS IV SOLN
INTRAVENOUS | Status: DC
  Administered 2013-04-20: 125 mL/h via INTRAVENOUS

## 2013-04-20 MED ORDER — OXYCODONE-ACETAMINOPHEN 5-325 MG PO TABS
1.0000 | ORAL_TABLET | ORAL | Status: DC | PRN
  Administered 2013-04-20 – 2013-04-21 (×6): 1 via ORAL
  Filled 2013-04-20 (×5): qty 1

## 2013-04-20 MED ORDER — DIPHENHYDRAMINE HCL 25 MG PO CAPS: 25.0000 mg | ORAL_CAPSULE | Freq: Four times a day (QID) | ORAL | Status: DC | PRN

## 2013-04-20 MED ORDER — FLEET ENEMA 7-19 GM/118ML RE ENEM: 1.0000 | ENEMA | RECTAL | Status: DC | PRN

## 2013-04-20 MED ORDER — ONDANSETRON HCL 4 MG/2ML IJ SOLN: 4.0000 mg | INTRAMUSCULAR | Status: DC | PRN

## 2013-04-20 MED ORDER — SENNOSIDES-DOCUSATE SODIUM 8.6-50 MG PO TABS
2.0000 | ORAL_TABLET | ORAL | Status: DC
  Administered 2013-04-22: 2 via ORAL

## 2013-04-20 MED ORDER — PRENATAL MULTIVITAMIN CH
1.0000 | ORAL_TABLET | Freq: Every day | ORAL | Status: DC
  Administered 2013-04-21 – 2013-04-22 (×2): 1 via ORAL
  Filled 2013-04-20 (×2): qty 1

## 2013-04-20 MED ORDER — DIBUCAINE 1 % RE OINT: 1.0000 "application " | TOPICAL_OINTMENT | RECTAL | Status: DC | PRN

## 2013-04-20 MED ORDER — BENZOCAINE-MENTHOL 20-0.5 % EX AERO
1.0000 "application " | INHALATION_SPRAY | CUTANEOUS | Status: DC | PRN
  Filled 2013-04-20: qty 56

## 2013-04-20 MED ORDER — TETANUS-DIPHTH-ACELL PERTUSSIS 5-2.5-18.5 LF-MCG/0.5 IM SUSP: 0.5000 mL | Freq: Once | INTRAMUSCULAR | Status: DC

## 2013-04-20 MED ORDER — OXYCODONE-ACETAMINOPHEN 5-325 MG PO TABS
1.0000 | ORAL_TABLET | ORAL | Status: DC | PRN
  Filled 2013-04-20: qty 1

## 2013-04-20 MED ORDER — OXYTOCIN BOLUS FROM INFUSION: 500.0000 mL | INTRAVENOUS | Status: DC

## 2013-04-20 MED ORDER — LANOLIN HYDROUS EX OINT: TOPICAL_OINTMENT | CUTANEOUS | Status: DC | PRN

## 2013-04-20 MED ORDER — LIDOCAINE HCL (PF) 1 % IJ SOLN
30.0000 mL | INTRAMUSCULAR | Status: DC | PRN
  Filled 2013-04-20: qty 30

## 2013-04-20 MED ORDER — CITRIC ACID-SODIUM CITRATE 334-500 MG/5ML PO SOLN: 30.0000 mL | ORAL | Status: DC | PRN

## 2013-04-20 MED ORDER — ONDANSETRON HCL 4 MG/2ML IJ SOLN: 4.0000 mg | Freq: Four times a day (QID) | INTRAMUSCULAR | Status: DC | PRN

## 2013-04-20 NOTE — H&P (Signed)
Benicia Moudy is a 32 y.o. female [phone] with IUP at [redacted]w[redacted]d presenting for onset of labor. Pt presented to MAU after having contractions, water had not broken, fetal movement present, no other complicating factors.  Membranes are intact.   [facility] at [facility] since 11 wks 3 days  Prenatal History/Complications:  Past Medical History: Past Medical History  Diagnosis Date  . Anemia     Past Surgical History: History reviewed. No pertinent past surgical history.  Obstetrical History: OB History   Grav Para Term Preterm Abortions TAB SAB Ect Mult Living   5 3 3  1  1   3       Gynecological History: Recent pelvic ultrasound with dermoid cysts seen bilaterally  Social History: History   Social History  . Marital Status: Married    Spouse Name: N/A    Number of Children: N/A  . Years of Education: N/A   Social History Main Topics  . Smoking status: Never Smoker   . Smokeless tobacco: None  . Alcohol Use: No  . Drug Use: No  . Sexual Activity: Yes   Other Topics Concern  . None   Social History Narrative  . None    Family History: Family History  Problem Relation Age of Onset  . Birth defects Neg Hx   . Early death Neg Hx   . Chromosomal disorder Neg Hx     Allergies: No Known Allergies  Prescriptions prior to admission  Medication Sig Dispense Refill  . prenatal vitamin w/FE, FA (NATACHEW) 29-1 MG CHEW Chew 1 tablet by mouth daily at 12 noon. May substituve PNV for patient preference.  30 tablet  11     Review of Systems   Constitutional: Negative for fever, chills, weight loss, malaise/fatigue and diaphoresis.  HENT: Negative for hearing loss, ear pain, nosebleeds, congestion, sore throat, neck pain, tinnitus and ear discharge.   Eyes: Negative for blurred vision, double vision, photophobia, pain, discharge and redness.  Respiratory: Negative for cough, hemoptysis, sputum production, shortness of breath, wheezing and stridor.   Cardiovascular: Negative  for chest pain, palpitations, orthopnea,  leg swelling  Gastrointestinal: Positive for abdominal pain. Negative for heartburn, nausea, vomiting, diarrhea, constipation, blood in stool Genitourinary: Negative for dysuria, urgency, frequency, hematuria and flank pain.  Musculoskeletal: Negative for myalgias, back pain, joint pain and falls.  Skin: Negative for itching and rash.  Neurological: Negative for dizziness, tingling, tremors, sensory change, speech change, focal weakness, seizures, loss of consciousness, weakness and headaches.  Endo/Heme/Allergies: Negative for environmental allergies and polydipsia. Does not bruise/bleed easily.  Psychiatric/Behavioral: Negative for depression, suicidal ideas, hallucinations, memory loss and substance abuse. The patient is not nervous/anxious and does not have insomnia.       Blood pressure 137/83, pulse 77, temperature 97.9 F (36.6 C), temperature source Oral, height 5\' 1"  (1.549 m), weight 123 lb (55.792 kg). General appearance: alert, cooperative, appears stated age and mild distress Lungs: clear to auscultation bilaterally Heart: regular rate and rhythm Abdomen: soft, non-tender; bowel sounds normal Extremities: Homans sign is negative, no sign of DVT Presentation: cephalic Fetal monitoringBaseline: 145 bpm, accelerations present, moderate variability, no decelerations. Uterine activityFrequency: Every 3-4 minutes Dilation: 7 Effacement (%): 100 Station: -1 Exam by:: K.Wilson,RN   Prenatal labs: ABO, Rh: O/POS/-- (03/10 1046) Antibody: NEG (03/10 1046) Rubella:  Immune (5.39) RPR: NON REAC (07/22 1106)  HBsAg: NEGATIVE (03/10 1046)  HIV: NON REACTIVE (07/22 1106)  GBS: NEGATIVE (09/11 0859)  1 hr Glucola: 81 Genetic screening  Declined Anatomy US normal anatomy, placental insertion above cervical os  Assessment: Kynli Wnuk is a 32 y.o. [id number] with an IUP at 102w2d presenting for onset of labor.  Plan: Admitted to L&D   Anticipate NSVD Fetal tracing Category I GBS: Negative Pain control: Pt declines anything except oral pain medication at this time Feeding preference: Breast/bottle Circ: N/A - Girl Bilateral ovarian dermoids - f/u at [facility] postpartum for pelvic ultrasound  Birth control: condoms; re-discuss nexplanon or IUD  Simone Curia, MD 04/20/2013, 8:45 PM

## 2013-04-20 NOTE — Progress Notes (Signed)
Approved interpreter by parents (formed signed) for Cassandra Lee to interpret for this hospital stay.  Via interpreter assistance, I instructed FOB on bulb syringe use and cleaning and importance of keeping infant warm, purpose/benefits of skin to skin, diaper care demonstrated & explained. Both parents were instructed on good handwashing once infant handled before initial bath, importance of maintaining all bracelets on them and infant, and safe positioning in crib of infant on back and not to sleep with infant in bed, chair, or couch. The parents offered no questions at this time.

## 2013-04-20 NOTE — MAU Note (Signed)
Reports contractions started earlier this afternoon. Having some discharge and some bleeding. Has  Been dilated to 2 cm in offic this week. Good fetal movement reported.

## 2013-04-21 LAB — RPR: RPR Ser Ql: NONREACTIVE

## 2013-04-21 NOTE — Progress Notes (Signed)
Consent signed by pt for personal interpreter during this hospital stay. Pt educated, assessed with interpreter assistance

## 2013-04-21 NOTE — Progress Notes (Signed)
Post Partum Day 1 Subjective: no complaints, voiding, tolerating PO and still complaining of lots of pain - receiving percocet now Up ad lib Not passing flatus yet POing water; Not POing food yet, will bring own cultural food today  Objective: Blood pressure 90/57, pulse 54, temperature 97.9 F (36.6 C), temperature source Oral, resp. rate 20, height 5\' 1"  (1.549 m), weight 123 lb (55.792 kg), unknown if currently breastfeeding.  Physical Exam:  General: alert, cooperative, appears stated age and no distress Lochia: appropriate Uterine Fundus: firm Incision: n/a DVT Evaluation: No evidence of DVT seen on physical exam. No cords or calf tenderness. No significant calf/ankle edema. Pulse RRR by 2+ bilateral radial and DP pulses  Recent Labs  04/20/13 1950  HGB 11.1*  HCT 35.4*    Assessment/Plan: S/p NSVD with no complications 10/5 at [redacted]w[redacted]d Plan for discharge tomorrow Breastfeeding, Lactation consult Contraception planning condoms - will rediscuss nexplanon or IUD prior to discharge Pain control - percocet plus stool softener Bilateral ovarian dermoids - f/u at [facility] postpartum for pelvic ultrasound   LOS: 1 day   Simone Curia 04/21/2013, 7:08 AM    I have seen and examined this patient and agree with above documentation in the resident's note. Uncomplicated vaginal delivery.  Post partum care as above.   Rulon Abide, M.D. [facility] Fellow 04/21/2013 7:51 AM

## 2013-04-21 NOTE — Progress Notes (Signed)
Ur chart review completed.  

## 2013-04-21 NOTE — Progress Notes (Signed)
Patient's personal interpreter Herbert Seta) line utilized to discuss plan of care for her and the baby for the shift and to see if she had any questions or concerns.

## 2013-04-21 NOTE — Lactation Note (Signed)
This note was copied from the chart of Cassandra Lee. Lactation Consultation Note  Patient Name: Cassandra Lee Today's Date: 04/21/2013 Reason for consult: Initial assessment Mom's sister-in-law present to interpreter, Hjiem Ayun. Mom denies questions or concerns regarding breastfeeding. Reports some mild nipple tenderness, no breakdown noted. Advised to apply EBM to sore nipples. Mom is experienced BF, some basics reviewed. Lactation brochure left for review. Advised of OP services and support group. Advised to call if would like LC assist.   Maternal Data Formula Feeding for Exclusion: Yes Reason for exclusion: Mother's choice to formula and breast feed on admission Infant to breast within first hour of birth: Yes Has patient been taught Hand Expression?: No (Declined to be taught hand expression) Does the patient have breastfeeding experience prior to this delivery?: Yes  Feeding Feeding Type: Breast Milk Length of feed: 10 min  LATCH Score/Interventions Latch: Grasps breast easily, tongue down, lips flanged, rhythmical sucking.  Audible Swallowing: A few with stimulation  Type of Nipple: Everted at rest and after stimulation  Comfort (Breast/Nipple): Soft / non-tender     Hold (Positioning): No assistance needed to correctly position infant at breast.  LATCH Score: 9  Lactation Tools Discussed/Used     Consult Status Consult Status: PRN    Alfred Levins 04/21/2013, 1:46 PM

## 2013-04-21 NOTE — H&P (Signed)
I spoke with and examined patient and agree with resident's note and plan of care.  Tawana Scale, MD OB Fellow 04/21/2013 12:01 AM

## 2013-04-22 MED ORDER — SENNOSIDES-DOCUSATE SODIUM 8.6-50 MG PO TABS: 2.0000 | ORAL_TABLET | ORAL | Status: DC

## 2013-04-22 MED ORDER — IBUPROFEN 600 MG PO TABS: 600.0000 mg | ORAL_TABLET | Freq: Four times a day (QID) | ORAL | Status: DC

## 2013-04-22 NOTE — Discharge Summary (Signed)
Obstetric Discharge Summary Reason for Admission: onset of labor Prenatal Procedures: none Intrapartum Procedures: spontaneous vaginal delivery Postpartum Procedures: none Complications-Operative and Postpartum: none Hemoglobin  Date Value Range Status  04/20/2013 11.1* 12.0 - 15.0 g/dL Final     REPEATED TO VERIFY     HCT  Date Value Range Status  04/20/2013 35.4* 36.0 - 46.0 % Final     REPEATED TO VERIFY    Physical Exam:  General: alert, cooperative, appears stated age and no distress Lochia: appropriate Uterine Fundus: firm DVT Evaluation: No evidence of DVT seen on physical exam. No cords or calf tenderness. No significant calf/ankle edema. RRR, no m/r/g Normal effort, CTAB   Discharge Diagnoses: Post-date pregnancy  Discharge Information: Date: 04/22/2013 Activity: pelvic rest Diet: routine Medications: PNV and Ibuprofen Condition: stable and improved Instructions: refer to practice specific booklet Discharge to: home Dermoid cysts bilaterally - Patient will need follow up with [facility] for repeat abdominal US postpartum, will discuss with nursing, attempt around 6weeks Breastfeeding and bottle feeding Planning condoms for contraception   Newborn Data: Live born female  Birth Weight: 7 lb 7 oz (3374 g) APGAR: 9, 9  Home with mother.  Cassandra Lee 04/22/2013, 9:45 AM  I have seen and examined this patient and agree with above documentation in the resident's note.   Rulon Abide, M.D. [facility] Fellow 04/22/2013 3:44 PM

## 2013-04-23 ENCOUNTER — Other Ambulatory Visit: Payer: Medicaid Other

## 2013-04-24 ENCOUNTER — Telehealth: Payer: Self-pay | Admitting: Family Medicine

## 2013-04-24 DIAGNOSIS — D369 Benign neoplasm, unspecified site: Secondary | ICD-10-CM

## 2013-04-24 NOTE — Telephone Encounter (Signed)
Patient needs [facility] appointment for pelvic US to evaluate bilateral dermoid cysts. Placing referral. Please call patient (family member Herbert Seta is interpreter - her numbers are [phone] and [phone]) to let her know of this appointment. Would like her to be seen around 6 weeks postpartum.  Also needs a 6 week postpartum check with me.  Leona Singleton, MD

## 2013-04-24 NOTE — Discharge Summary (Signed)
Attestation of Attending Supervision of Fellow: Evaluation and management procedures were performed by the Fellow under my supervision and collaboration.  I have reviewed the Fellow's note and chart, and I agree with the management and plan.    

## 2013-04-25 NOTE — Telephone Encounter (Signed)
Attempted to call Cassandra Lee to inform of ultrasound appt 05/28/13@ 10:30am at [facility].  Left a message but not sure if right number since person on voicemail said name was Cassandra Lee.  Will mail appt card to pt.  Please give message if they call back. Kuba Shepherd,CMA

## 2013-05-27 ENCOUNTER — Encounter: Payer: Self-pay | Admitting: Family Medicine

## 2013-05-27 ENCOUNTER — Ambulatory Visit (INDEPENDENT_AMBULATORY_CARE_PROVIDER_SITE_OTHER): Payer: Medicaid Other | Admitting: Family Medicine

## 2013-05-27 DIAGNOSIS — D369 Benign neoplasm, unspecified site: Secondary | ICD-10-CM

## 2013-05-27 DIAGNOSIS — Z3480 Encounter for supervision of other normal pregnancy, unspecified trimester: Secondary | ICD-10-CM

## 2013-05-27 NOTE — Patient Instructions (Signed)
I am glad you are doing well. Please see me for any major issues or if you decide on another birth control method. Condoms are not the most effective method so it is still possible to become pregnant. Follow up tomorrow the 12th at 10:30AM at [facility] for your ultrasound.  Contraception Choices Birth control (contraception) is the use of any methods or devices to stop pregnancy from happening. Below are some methods to help avoid pregnancy. HORMONAL BIRTH CONTROL  A small tube put under the skin of the upper arm (implant). The tube can stay in place for 3 years. The implant must be taken out after 3 years.  Shots given every 3 months.  Pills taken every day.  Patches that are changed once a week.  A ring put into the vagina (vaginal ring). The ring is left in place for 3 weeks and removed for 1 week. Then, a new ring is put in the vagina.  Emergency birth control pills taken after unprotected sex (intercourse). BARRIER BIRTH CONTROL   A thin covering worn on the penis (female condom) during sex.  A soft, loose covering put into the vagina (female condom) before sex.  A rubber bowl that sits over the cervix (diaphragm). The bowl must be made for you. The bowl is put into the vagina before sex. The bowl is left in place for 6 to 8 hours after sex.  A small, soft cup that fits over the cervix (cervical cap). The cup must be made for you. The cup can be left in place for 48 hours after sex.  A sponge that is put into the vagina before sex.  A chemical that kills or stops sperm from getting into the cervix and uterus (spermicide). The chemical may be a cream, jelly, foam, or pill. INTRAUTERINE (IUD) BIRTH CONTROL   IUD birth control is a small, T-shaped piece of plastic. The plastic is put inside the uterus. There are 2 types of IUD:  Copper IUD. The IUD is covered in copper wire. The copper makes a fluid that kills sperm. It can stay in place for 10 years.  Hormone IUD. The  hormone stops pregnancy from happening. It can stay in place for 5 years. PERMANENT METHODS  When the woman has her fallopian tubes sealed, tied, or blocked during surgery. This stops the egg from traveling to the uterus.  The doctor places a small coil or insert into each fallopian tube. This causes scar tissue to form and blocks the fallopian tubes.  When the female has the tubes that carry sperm tied off (vasectomy). NATURAL FAMILY PLANNING BIRTH CONTROL   Natural family planning means not having sex or using barrier birth control on the days the woman could become pregnant.  Use a calendar to keep track of the length of each period and know the days she can get pregnant.  Avoid sex during ovulation.  Use a thermometer to measure body temperature. Also watch for symptoms of ovulation.  Time sex to be after the woman has ovulated. Use condoms to help protect yourself against sexually transmitted infections (STIs). Do this no matter what type of birth control you use. Talk to your doctor about which type of birth control is best for you. Document Released: 04/30/2009 Document Revised: 03/05/2013 Document Reviewed: 01/22/2013 [facility] Patient Information 2014 [place], [place].

## 2013-05-27 NOTE — Progress Notes (Signed)
Patient ID: Cassandra Lee, female   DOB: 1980-08-29, 32 y.o.   MRN: [id number] Subjective:   CC: Postpartum visit  HPI:   1. Postpartum Visit Patient is here for a postpartum visit. She is 6 weeks postpartum following a spontaneous vaginal delivery. I have fully reviewed the prenatal and intrapartum course. The delivery was at [redacted]w[redacted]d gestational weeks. Outcome: spontaneous vaginal delivery. Anesthesia: none.  Postpartum course has been unremarkable. Home support is husband. Baby's course has been unremarkable. Baby is feeding by both breast and bottle - WIC. Bleeding no bleeding. Bowel function is normal. Bladder function is normal. Patient is not sexually active. Contraception method is abstinence and condoms planned for when she becomes sexually active. Postpartum depression screening: negative.  Discussed birth control - not wanting other options at this time.  Dermoid cysts - Planning for abdominal US 11/12. Pt made aware.   Review of Systems - Per HPI. None.  SH: [name] (previous sister-in-law) left the family and is no longer in touch with Shanyce.  Helmi reports doing okay at home.  Health Maintenance:  Got flu shot this preg 9/11    Objective:  Physical Exam BP 131/83  Pulse 87  Temp(Src) 98.1 F (36.7 C) (Oral)  Ht 5\' 1"  (1.549 m)  Wt 106 lb (48.081 kg)  BMI 20.04 kg/m2 GEN: NAD CV: RRR PULM: CTAB ABD: Soft, nontender, nondistended EXTR: No LE edema    Assessment:     Cassandra Lee is a 32 y.o. female here for postpartum visit    Plan:     # See problem list and after visit summary for problem-specific plans.

## 2013-05-27 NOTE — Assessment & Plan Note (Signed)
Stable.  - Has pelvic/TVUS planned 11/12 at 10:30 AM, at [facility] - F/u with me after to discuss results

## 2013-05-27 NOTE — Assessment & Plan Note (Addendum)
Doing well postpartum, breast/bottle feeding - No postpartum bleeding, no depression - F/u in 2-3 weeks after [place] - F/u sooner PRN any issues  - Contraception: Pt prefers "my own method" which includes condoms.  - Discussed failure rate and gave list of other options - f/u prn to discuss birth control options if decides on something other than condoms.

## 2013-05-28 ENCOUNTER — Ambulatory Visit (HOSPITAL_COMMUNITY)
Admission: RE | Admit: 2013-05-28 | Discharge: 2013-05-28 | Disposition: A | Discharge status: Home / Self Care | Payer: Medicaid Other | Source: Ambulatory Visit | Attending: Family Medicine | Admitting: Family Medicine

## 2013-05-28 DIAGNOSIS — D279 Benign neoplasm of unspecified ovary: Secondary | ICD-10-CM | POA: Insufficient documentation

## 2013-05-28 DIAGNOSIS — D369 Benign neoplasm, unspecified site: Secondary | ICD-10-CM

## 2013-06-04 ENCOUNTER — Telehealth: Payer: Self-pay | Admitting: Family Medicine

## 2013-06-04 DIAGNOSIS — D369 Benign neoplasm, unspecified site: Secondary | ICD-10-CM

## 2013-06-04 NOTE — Telephone Encounter (Signed)
Patient needs Gyn visit before seeing me, to discuss possible surgery/biopsy of masses found on [place]. Please call her (with translator line) to help set this up and change her current appt with me. Referral placed. Thank you!  Leona Singleton, MD 06/04/2013 11:23 AM

## 2013-06-09 NOTE — Telephone Encounter (Signed)
Spoke with sister in law Hjiem (hi-jim) and she is aware that women's will be contacting them for an appt.  Will cancel appt on Wednesday until after pt sees gyn.  Channell Quattrone,CMA

## 2013-06-11 ENCOUNTER — Ambulatory Visit: Payer: Medicaid Other | Admitting: Family Medicine

## 2013-07-01 ENCOUNTER — Encounter: Payer: Self-pay | Admitting: Obstetrics & Gynecology

## 2013-07-29 ENCOUNTER — Ambulatory Visit (INDEPENDENT_AMBULATORY_CARE_PROVIDER_SITE_OTHER): Payer: Medicaid Other | Admitting: Family Medicine

## 2013-07-29 ENCOUNTER — Encounter: Payer: Self-pay | Admitting: Family Medicine

## 2013-07-29 VITALS — BP 110/74 | HR 79 | Temp 98.2°F | Ht 61.0 in | Wt 114.0 lb

## 2013-07-29 DIAGNOSIS — R0789 Other chest pain: Secondary | ICD-10-CM | POA: Insufficient documentation

## 2013-07-29 DIAGNOSIS — R071 Chest pain on breathing: Secondary | ICD-10-CM

## 2013-07-29 NOTE — Patient Instructions (Signed)
Thank you for coming in today. Please take ibuprofen 600 mg three times daily for the next 7 days for what sounds like costochondritis.   Follow up with Dr. Dianah Field in 10-14 days.   If you develop worsening pain, shortness of breath or dizziness upon standing please seek immediate medical attention.   Dr. Adrian Blackwater   Costochondritis Costochondritis is a condition in which the tissue (cartilage) that connects your ribs with your breastbone (sternum) becomes irritated. It causes pain in the chest and rib area. It usually goes away on its own over time. HOME CARE  Avoid activities that wear you out.  Do not strain your ribs. Avoid activities that use your:  Chest.  Belly.  Side muscles.  Put ice on the area for the first 2 days after the pain starts.  Put ice in a plastic bag.  Place a towel between your skin and the bag.  Leave the ice on for 20 minutes, 2 3 times a day.  Only take medicine as told by your doctor. GET HELP IF:  You have redness or puffiness (swelling) in the rib area.  Your pain does not go away with rest or medicine. GET HELP RIGHT AWAY IF:   Your pain gets worse.  You are very uncomfortable.  You have trouble breathing.  You cough up blood.  You start sweating or throwing up (vomiting).  You have a fever or lasting symptoms for more than 2 3 days.  You have a fever and your symptoms suddenly get worse. MAKE SURE YOU:   Understand these instructions.  Will watch your condition.  Will get help right away if you are not doing well or get worse. Document Released: 12/20/2007 Document Revised: 03/05/2013 Document Reviewed: 02/04/2013 [facility] Patient Information 2014 [place].

## 2013-07-29 NOTE — Progress Notes (Signed)
   Subjective:    Patient ID: Cassandra Lee, female    DOB: 12-24-1980, 33 y.o.   MRN: [id number]  HPI 33 yo [place] female presents with interpreter for f/u visit:  1. Chest pain: x 2 weeks. Location is anterior chest wall, parasternal, superior chest near shoulders and mid back. Quality is ache. Course is constant. Severity is Moderate to severe. No preceding injury. No breast pain, skin changes, or nipple discharge. No associated SOB, dizziness, cough, fever, URI symptoms. Pain may be worse with deep breathing. No treatment to date. She is breastfeeding 69 week old daughter. No hormonal contraception, travel, personal or family history of DVT/PE.   Soc hx: non-smoker   Review of Systems As per HPI     Objective:   Physical Exam BP 110/74  Pulse 79  Temp(Src) 98.2 F (36.8 C) (Oral)  Ht 5\' 1"  (1.549 m)  Wt 114 lb (51.71 kg)  BMI 21.55 kg/m2 General appearance: alert, cooperative and no distress Back: symmetric, no curvature. ROM normal. No CVA tenderness. Lungs: clear to auscultation bilaterally Heart: regular rate and rhythm, S1, S2 normal, no murmur, click, rub or gallop Breasts: normal appearance, no masses or tenderness, Inspection negative, No nipple retraction or dimpling, No nipple discharge or bleeding, No axillary or supraclavicular adenopathy, Normal to palpation without dominant masses Chest wall: TTP parasternal, TTP superior. No back tenderness elicited.     Assessment & Plan:

## 2013-07-29 NOTE — Assessment & Plan Note (Signed)
A: low risk for PE. Suspect costochondritis. No infectious symptoms.  P: Trial of ibuprofen 600 mg TID with food x 7 days. F/u with PCP in 10-14 days. Reviewed s/s to prompt return to care sooner.

## 2013-08-13 ENCOUNTER — Encounter: Payer: Medicaid Other | Admitting: Obstetrics & Gynecology

## 2014-05-18 ENCOUNTER — Encounter: Payer: Self-pay | Admitting: Family Medicine

## 2014-07-17 NOTE — L&D Delivery Note (Signed)
Patient is 34 y.o. [id number] [redacted]w[redacted]d admitted for SOL   Delivery Note At 6:09 AM a viable female was delivered via Vaginal, Spontaneous Delivery (Presentation: Left; Occiput Anterior).  APGAR: 9, 9; weight pending.   Placenta status: Intact, Spontaneous.  Cord: 3 vessels with the following complications: None. Anesthesia: None  Episiotomy: None Lacerations: None Suture Repair: none Est. Blood Loss (mL):    Mom to postpartum.  Baby to Couplet care / Skin to Skin.  [name] 04/05/2015, 6:31 AM

## 2014-12-17 ENCOUNTER — Other Ambulatory Visit (HOSPITAL_COMMUNITY): Payer: Self-pay | Admitting: Physician Assistant

## 2014-12-17 DIAGNOSIS — Z3689 Encounter for other specified antenatal screening: Secondary | ICD-10-CM

## 2014-12-17 LAB — OB RESULTS CONSOLE ABO/RH: RH TYPE: POSITIVE

## 2014-12-17 LAB — OB RESULTS CONSOLE ANTIBODY SCREEN: Antibody Screen: NEGATIVE

## 2014-12-17 LAB — OB RESULTS CONSOLE RPR: RPR: NONREACTIVE

## 2014-12-17 LAB — OB RESULTS CONSOLE HIV ANTIBODY (ROUTINE TESTING): HIV: NONREACTIVE

## 2014-12-17 LAB — OB RESULTS CONSOLE RUBELLA ANTIBODY, IGM: Rubella: IMMUNE

## 2014-12-17 LAB — OB RESULTS CONSOLE HEPATITIS B SURFACE ANTIGEN: HEP B S AG: NEGATIVE

## 2014-12-22 ENCOUNTER — Ambulatory Visit (HOSPITAL_COMMUNITY)
Admission: RE | Admit: 2014-12-22 | Discharge: 2014-12-22 | Disposition: A | Discharge status: Home / Self Care | Payer: Medicaid Other | Source: Ambulatory Visit | Attending: Physician Assistant | Admitting: Physician Assistant

## 2014-12-22 ENCOUNTER — Other Ambulatory Visit (HOSPITAL_COMMUNITY): Payer: Self-pay | Admitting: Physician Assistant

## 2014-12-22 DIAGNOSIS — Z36 Encounter for antenatal screening of mother: Secondary | ICD-10-CM | POA: Insufficient documentation

## 2014-12-22 DIAGNOSIS — Z3689 Encounter for other specified antenatal screening: Secondary | ICD-10-CM

## 2014-12-23 DIAGNOSIS — Z3689 Encounter for other specified antenatal screening: Secondary | ICD-10-CM | POA: Insufficient documentation

## 2014-12-23 DIAGNOSIS — Z3A24 24 weeks gestation of pregnancy: Secondary | ICD-10-CM | POA: Insufficient documentation

## 2014-12-23 DIAGNOSIS — O09529 Supervision of elderly multigravida, unspecified trimester: Secondary | ICD-10-CM | POA: Insufficient documentation

## 2014-12-31 ENCOUNTER — Other Ambulatory Visit (HOSPITAL_COMMUNITY): Payer: Self-pay | Admitting: Physician Assistant

## 2014-12-31 DIAGNOSIS — Z0489 Encounter for examination and observation for other specified reasons: Secondary | ICD-10-CM

## 2014-12-31 DIAGNOSIS — IMO0002 Reserved for concepts with insufficient information to code with codable children: Secondary | ICD-10-CM

## 2015-01-27 ENCOUNTER — Ambulatory Visit (HOSPITAL_COMMUNITY)
Admission: RE | Admit: 2015-01-27 | Discharge: 2015-01-27 | Disposition: A | Discharge status: Home / Self Care | Payer: Medicaid Other | Source: Ambulatory Visit | Attending: Physician Assistant | Admitting: Physician Assistant

## 2015-01-27 DIAGNOSIS — Z36 Encounter for antenatal screening of mother: Secondary | ICD-10-CM | POA: Diagnosis not present

## 2015-01-27 DIAGNOSIS — Z0489 Encounter for examination and observation for other specified reasons: Secondary | ICD-10-CM

## 2015-01-27 DIAGNOSIS — IMO0002 Reserved for concepts with insufficient information to code with codable children: Secondary | ICD-10-CM

## 2015-01-28 DIAGNOSIS — Z3A29 29 weeks gestation of pregnancy: Secondary | ICD-10-CM | POA: Insufficient documentation

## 2015-01-28 DIAGNOSIS — Z0489 Encounter for examination and observation for other specified reasons: Secondary | ICD-10-CM | POA: Insufficient documentation

## 2015-01-28 DIAGNOSIS — IMO0002 Reserved for concepts with insufficient information to code with codable children: Secondary | ICD-10-CM | POA: Insufficient documentation

## 2015-03-10 ENCOUNTER — Encounter: Payer: Self-pay | Admitting: *Deleted

## 2015-03-17 LAB — OB RESULTS CONSOLE GBS: GBS: NEGATIVE

## 2015-03-17 LAB — OB RESULTS CONSOLE GC/CHLAMYDIA
CHLAMYDIA, DNA PROBE: NEGATIVE
Gonorrhea: NEGATIVE

## 2015-04-05 ENCOUNTER — Inpatient Hospital Stay (HOSPITAL_COMMUNITY): Payer: Medicaid Other | Admitting: Certified Registered Nurse Anesthetist

## 2015-04-05 ENCOUNTER — Encounter (HOSPITAL_COMMUNITY)
Admission: AD | Disposition: A | Discharge status: Home / Self Care | Payer: Self-pay | Source: Ambulatory Visit | Attending: Family Medicine

## 2015-04-05 ENCOUNTER — Inpatient Hospital Stay (HOSPITAL_COMMUNITY)
Admission: AD | Admit: 2015-04-05 | Discharge: 2015-04-06 | DRG: 767 | Disposition: A | Discharge status: Home / Self Care | Payer: Medicaid Other | Source: Ambulatory Visit | Attending: Family Medicine | Admitting: Family Medicine

## 2015-04-05 ENCOUNTER — Encounter (HOSPITAL_COMMUNITY): Payer: Self-pay | Admitting: *Deleted

## 2015-04-05 DIAGNOSIS — Z302 Encounter for sterilization: Secondary | ICD-10-CM

## 2015-04-05 DIAGNOSIS — O09523 Supervision of elderly multigravida, third trimester: Secondary | ICD-10-CM | POA: Diagnosis not present

## 2015-04-05 DIAGNOSIS — O322XX0 Maternal care for transverse and oblique lie, not applicable or unspecified: Secondary | ICD-10-CM | POA: Diagnosis present

## 2015-04-05 DIAGNOSIS — IMO0001 Reserved for inherently not codable concepts without codable children: Secondary | ICD-10-CM

## 2015-04-05 DIAGNOSIS — Z3A38 38 weeks gestation of pregnancy: Secondary | ICD-10-CM | POA: Diagnosis present

## 2015-04-05 HISTORY — PX: TUBAL LIGATION: SHX77

## 2015-04-05 LAB — CBC
HEMATOCRIT: 34.9 % — AB (ref 36.0–46.0)
HEMOGLOBIN: 11 g/dL — AB (ref 12.0–15.0)
MCH: 22.8 pg — ABNORMAL LOW (ref 26.0–34.0)
MCHC: 31.5 g/dL (ref 30.0–36.0)
MCV: 72.4 fL — ABNORMAL LOW (ref 78.0–100.0)
Platelets: 333 10*3/uL (ref 150–400)
RBC: 4.82 MIL/uL (ref 3.87–5.11)
RDW: 14.2 % (ref 11.5–15.5)
WBC: 12.8 10*3/uL — AB (ref 4.0–10.5)

## 2015-04-05 LAB — TYPE AND SCREEN
ABO/RH(D): O POS
ANTIBODY SCREEN: NEGATIVE

## 2015-04-05 SURGERY — LIGATION, FALLOPIAN TUBE, POSTPARTUM
Anesthesia: Spinal

## 2015-04-05 MED ORDER — MEPERIDINE HCL 25 MG/ML IJ SOLN: 6.2500 mg | INTRAMUSCULAR | Status: DC | PRN

## 2015-04-05 MED ORDER — EPHEDRINE SULFATE 50 MG/ML IJ SOLN
INTRAMUSCULAR | Status: DC | PRN
  Administered 2015-04-05 (×2): 10 mg via INTRAVENOUS

## 2015-04-05 MED ORDER — LACTATED RINGERS IV SOLN
INTRAVENOUS | Status: DC
  Administered 2015-04-05 (×2): via INTRAVENOUS

## 2015-04-05 MED ORDER — BUPIVACAINE HCL (PF) 0.25 % IJ SOLN
INTRAMUSCULAR | Status: AC
  Filled 2015-04-05: qty 30

## 2015-04-05 MED ORDER — ONDANSETRON HCL 4 MG/2ML IJ SOLN
4.0000 mg | INTRAMUSCULAR | Status: DC | PRN
Start: 2015-04-05 — End: 2015-04-06

## 2015-04-05 MED ORDER — SENNOSIDES-DOCUSATE SODIUM 8.6-50 MG PO TABS
2.0000 | ORAL_TABLET | ORAL | Status: DC
  Administered 2015-04-05: 2 via ORAL
  Filled 2015-04-05: qty 2

## 2015-04-05 MED ORDER — IBUPROFEN 600 MG PO TABS
600.0000 mg | ORAL_TABLET | Freq: Four times a day (QID) | ORAL | Status: DC
  Administered 2015-04-05 (×2): 600 mg via ORAL
  Filled 2015-04-05 (×3): qty 1

## 2015-04-05 MED ORDER — BUPIVACAINE HCL (PF) 0.25 % IJ SOLN
INTRAMUSCULAR | Status: DC | PRN
  Administered 2015-04-05: 20 mL

## 2015-04-05 MED ORDER — ACETAMINOPHEN 325 MG PO TABS: 650.0000 mg | ORAL_TABLET | ORAL | Status: DC | PRN

## 2015-04-05 MED ORDER — ZOLPIDEM TARTRATE 5 MG PO TABS: 5.0000 mg | ORAL_TABLET | Freq: Every evening | ORAL | Status: DC | PRN

## 2015-04-05 MED ORDER — PRENATAL MULTIVITAMIN CH: 1.0000 | ORAL_TABLET | Freq: Every day | ORAL | Status: DC

## 2015-04-05 MED ORDER — PROPOFOL INFUSION 10 MG/ML OPTIME
INTRAVENOUS | Status: DC | PRN
  Administered 2015-04-05: 50 ug/kg/min via INTRAVENOUS

## 2015-04-05 MED ORDER — CITRIC ACID-SODIUM CITRATE 334-500 MG/5ML PO SOLN
30.0000 mL | ORAL | Status: DC | PRN
  Administered 2015-04-05: 30 mL via ORAL
  Filled 2015-04-05: qty 15

## 2015-04-05 MED ORDER — OXYCODONE HCL 5 MG PO TABS: 5.0000 mg | ORAL_TABLET | Freq: Once | ORAL | Status: DC | PRN

## 2015-04-05 MED ORDER — ONDANSETRON HCL 4 MG/2ML IJ SOLN: 4.0000 mg | Freq: Four times a day (QID) | INTRAMUSCULAR | Status: DC | PRN

## 2015-04-05 MED ORDER — LACTATED RINGERS IV SOLN: 500.0000 mL | INTRAVENOUS | Status: DC | PRN

## 2015-04-05 MED ORDER — OXYCODONE HCL 5 MG/5ML PO SOLN: 5.0000 mg | Freq: Once | ORAL | Status: DC | PRN

## 2015-04-05 MED ORDER — PROMETHAZINE HCL 25 MG/ML IJ SOLN: 6.2500 mg | INTRAMUSCULAR | Status: DC | PRN

## 2015-04-05 MED ORDER — OXYTOCIN BOLUS FROM INFUSION
500.0000 mL | INTRAVENOUS | Status: DC
Start: 2015-04-05 — End: 2015-04-05
  Administered 2015-04-05: 500 mL via INTRAVENOUS

## 2015-04-05 MED ORDER — BENZOCAINE-MENTHOL 20-0.5 % EX AERO: 1.0000 "application " | INHALATION_SPRAY | CUTANEOUS | Status: DC | PRN

## 2015-04-05 MED ORDER — LANOLIN HYDROUS EX OINT: TOPICAL_OINTMENT | CUTANEOUS | Status: DC | PRN

## 2015-04-05 MED ORDER — HYDROMORPHONE HCL 1 MG/ML IJ SOLN
0.2500 mg | INTRAMUSCULAR | Status: DC | PRN
Start: 2015-04-05 — End: 2015-04-05

## 2015-04-05 MED ORDER — OXYCODONE-ACETAMINOPHEN 5-325 MG PO TABS
1.0000 | ORAL_TABLET | ORAL | Status: DC | PRN
  Administered 2015-04-06: 1 via ORAL
  Filled 2015-04-05: qty 1

## 2015-04-05 MED ORDER — WITCH HAZEL-GLYCERIN EX PADS: 1.0000 "application " | MEDICATED_PAD | CUTANEOUS | Status: DC | PRN

## 2015-04-05 MED ORDER — OXYTOCIN 40 UNITS IN LACTATED RINGERS INFUSION - SIMPLE MED
INTRAVENOUS | Status: AC
  Filled 2015-04-05: qty 1000

## 2015-04-05 MED ORDER — DIBUCAINE 1 % RE OINT: 1.0000 "application " | TOPICAL_OINTMENT | RECTAL | Status: DC | PRN

## 2015-04-05 MED ORDER — OXYTOCIN 40 UNITS IN LACTATED RINGERS INFUSION - SIMPLE MED: 62.5000 mL/h | INTRAVENOUS | Status: DC

## 2015-04-05 MED ORDER — OXYTOCIN 10 UNIT/ML IJ SOLN
INTRAMUSCULAR | Status: AC
  Filled 2015-04-05: qty 1

## 2015-04-05 MED ORDER — OXYCODONE-ACETAMINOPHEN 5-325 MG PO TABS: 1.0000 | ORAL_TABLET | ORAL | Status: DC | PRN

## 2015-04-05 MED ORDER — ACETAMINOPHEN 325 MG PO TABS
650.0000 mg | ORAL_TABLET | ORAL | Status: DC | PRN
  Administered 2015-04-05 – 2015-04-06 (×2): 650 mg via ORAL
  Filled 2015-04-05 (×2): qty 2

## 2015-04-05 MED ORDER — ONDANSETRON HCL 4 MG PO TABS: 4.0000 mg | ORAL_TABLET | ORAL | Status: DC | PRN

## 2015-04-05 MED ORDER — KETOROLAC TROMETHAMINE 30 MG/ML IJ SOLN
INTRAMUSCULAR | Status: AC
Start: 2015-04-05 — End: 2015-04-06
  Filled 2015-04-05: qty 1

## 2015-04-05 MED ORDER — LIDOCAINE HCL (PF) 1 % IJ SOLN: 30.0000 mL | INTRAMUSCULAR | Status: DC | PRN

## 2015-04-05 MED ORDER — LIDOCAINE HCL (PF) 1 % IJ SOLN
INTRAMUSCULAR | Status: AC
  Filled 2015-04-05: qty 30

## 2015-04-05 MED ORDER — SIMETHICONE 80 MG PO CHEW: 80.0000 mg | CHEWABLE_TABLET | ORAL | Status: DC | PRN

## 2015-04-05 MED ORDER — KETOROLAC TROMETHAMINE 30 MG/ML IJ SOLN
30.0000 mg | Freq: Once | INTRAMUSCULAR | Status: AC | PRN
  Administered 2015-04-05: 30 mg via INTRAVENOUS

## 2015-04-05 MED ORDER — BUPIVACAINE IN DEXTROSE 0.75-8.25 % IT SOLN
INTRATHECAL | Status: DC | PRN
  Administered 2015-04-05: 9 mg via INTRATHECAL

## 2015-04-05 MED ORDER — LACTATED RINGERS IV SOLN
INTRAVENOUS | Status: DC
  Administered 2015-04-05: 09:00:00 via INTRAVENOUS

## 2015-04-05 MED ORDER — OXYCODONE-ACETAMINOPHEN 5-325 MG PO TABS: 2.0000 | ORAL_TABLET | ORAL | Status: DC | PRN

## 2015-04-05 MED ORDER — DIPHENHYDRAMINE HCL 25 MG PO CAPS: 25.0000 mg | ORAL_CAPSULE | Freq: Four times a day (QID) | ORAL | Status: DC | PRN

## 2015-04-05 SURGICAL SUPPLY — 18 items
CHLORAPREP W/TINT 26ML (MISCELLANEOUS) ×2 IMPLANT
CLIP FILSHIE TUBAL LIGA STRL (Clip) ×2 IMPLANT
CLOTH BEACON ORANGE TIMEOUT ST (SAFETY) ×2 IMPLANT
DRSG OPSITE POSTOP 3X4 (GAUZE/BANDAGES/DRESSINGS) ×2 IMPLANT
GLOVE BIOGEL PI IND STRL 7.5 (GLOVE) ×1 IMPLANT
GLOVE BIOGEL PI INDICATOR 7.5 (GLOVE) ×1
GLOVE ECLIPSE 7.5 STRL STRAW (GLOVE) ×2 IMPLANT
GOWN STRL REUS W/TWL LRG LVL3 (GOWN DISPOSABLE) ×4 IMPLANT
NEEDLE HYPO 22GX1.5 SAFETY (NEEDLE) ×2 IMPLANT
NS IRRIG 1000ML POUR BTL (IV SOLUTION) ×2 IMPLANT
PACK ABDOMINAL MINOR (CUSTOM PROCEDURE TRAY) ×2 IMPLANT
SPONGE LAP 4X18 X RAY DECT (DISPOSABLE) IMPLANT
SUT VICRYL 0 UR6 27IN ABS (SUTURE) ×2 IMPLANT
SUT VICRYL 4-0 PS2 18IN ABS (SUTURE) ×2 IMPLANT
SYR CONTROL 10ML LL (SYRINGE) ×2 IMPLANT
TOWEL OR 17X24 6PK STRL BLUE (TOWEL DISPOSABLE) ×4 IMPLANT
TRAY FOLEY CATH SILVER 14FR (SET/KITS/TRAYS/PACK) ×2 IMPLANT
WATER STERILE IRR 1000ML POUR (IV SOLUTION) ×2 IMPLANT

## 2015-04-05 NOTE — Progress Notes (Signed)
Pt in room at 0609. SVD at 0609.

## 2015-04-05 NOTE — Anesthesia Postprocedure Evaluation (Signed)
  Anesthesia Post-op Note  Patient: Cassandra Lee  Procedure(s) Performed: Procedure(s): POST PARTUM TUBAL LIGATION (N/A)  Patient Location: PACU  Anesthesia Type:Spinal  Level of Consciousness: awake, oriented and patient cooperative  Airway and Oxygen Therapy: Patient Spontanous Breathing  Post-op Pain: none  Post-op Assessment: No signs of Nausea or vomiting, Adequate PO intake, Pain level controlled, No headache, No backache and Patient able to bend at knees LLE Motor Response: Purposeful movement LLE Sensation: Decreased RLE Motor Response: Purposeful movement RLE Sensation: Decreased      Post-op Vital Signs: Reviewed and stable  Last Vitals:  Filed Vitals:   04/05/15 1455  BP: 103/57  Pulse: 64  Temp: 36.8 C  Resp: 20    Complications: No apparent anesthesia complications

## 2015-04-05 NOTE — MAU Note (Signed)
Pt reports ROM, feeling pressure.  Transferred to 171 via stretcher with family and Dr. Martie Round.

## 2015-04-05 NOTE — MAU Note (Signed)
Pt reports contractions, some spotting.

## 2015-04-05 NOTE — H&P (Signed)
OBSTETRIC ADMISSION HISTORY AND PHYSICAL  Cassandra Lee is a 34 y.o. female [id number] with IUP at [redacted]w[redacted]d by 2nd trimester u/s presenting for SOL. She reports +FMs, No LOF, no VB, no blurry vision, headaches or peripheral edema, and RUQ pain.  She plans on breast feeding. She requests BTL for birth control, papers signed 1 month ago  Dating: By 2nd tri u/s --->  Estimated Date of Delivery: 04/13/15  Sono:    @[redacted]w[redacted]d , CWD, normal anatomy-nd f/u for spine and heart-poorly visualized, transverse presentation, 645g, 51% EFW @[redacted]w[redacted]d , CWD, normal anatomy, 1408g, 59% EFW  Prenatal History/Complications:  Past Medical History: Past Medical History  Diagnosis Date  . Anemia     Past Surgical History: History reviewed. No pertinent past surgical history.  Obstetrical History: OB History    Gravida Para Term Preterm AB TAB SAB Ectopic Multiple Living   6 4 4  1  1   4       Social History: Social History   Social History  . Marital Status: Married    Spouse Name: N/A  . Number of Children: N/A  . Years of Education: N/A   Social History Main Topics  . Smoking status: Never Smoker   . Smokeless tobacco: None  . Alcohol Use: No  . Drug Use: No  . Sexual Activity: Yes   Other Topics Concern  . None   Social History Narrative    Family History: Family History  Problem Relation Age of Onset  . Birth defects Neg Hx   . Early death Neg Hx   . Chromosomal disorder Neg Hx     Allergies: No Known Allergies  Prescriptions prior to admission  Medication Sig Dispense Refill Last Dose  . ibuprofen (ADVIL,MOTRIN) 600 MG tablet Take 1 tablet (600 mg total) by mouth every 6 (six) hours. 30 tablet 0 Not Taking  . prenatal vitamin w/FE, FA (NATACHEW) 29-1 MG CHEW Chew 1 tablet by mouth daily at 12 noon. May substituve PNV for patient preference. 30 tablet 11 Not Taking  . senna-docusate (SENOKOT-S) 8.6-50 MG per tablet Take 2 tablets by mouth daily. 30 tablet 0 Not Taking     Review of  Systems   All systems reviewed and negative except as stated in HPI  Blood pressure 110/76, pulse 89, temperature 98.2 F (36.8 C), temperature source Oral, resp. rate 18, height 5' (1.524 m), weight 124 lb (56.246 kg), SpO2 100 %. General appearance: alert, cooperative, appears stated age and mild distress Lungs: clear to auscultation bilaterally Heart: regular rate and rhythm Abdomen: soft, non-tender; bowel sounds normal Extremities: Homans sign is negative, no sign of DVT DTR's wnl Presentation: cephalic Fetal monitoringBaseline: 140 bpm, Variability: Good {> 6 bpm), Accelerations: Reactive and Decelerations: Absent Uterine activityFrequency: Every 2 minutes Dilation: 6 Effacement (%): 80 Station: -2 Exam by:: Felicity Pellegrini, DO   Prenatal labs: ABO, Rh: O/Positive/-- (06/02 0000) Antibody: Negative (06/02 0000) Rubella:  immune RPR: Nonreactive (06/02 0000)  HBsAg: Negative (06/02 0000)  HIV: Non-reactive (06/02 0000)  GBS: Negative (08/31 0000)  1 hr Glucola 144, 3hr WNL Genetic screening  Too late for quad Anatomy US wnl  Prenatal Transfer Tool  Maternal Diabetes: No Genetic Screening: Declined Maternal Ultrasounds/Referrals: Normal Fetal Ultrasounds or other Referrals:  None Maternal Substance Abuse:  No Significant Maternal Medications:  None Significant Maternal Lab Results: None  No results found for this or any previous visit (from the past 24 hour(s)).  Patient Active Problem List   Diagnosis Date Noted  .  Evaluate anatomy not seen on prior sonogram   . [redacted] weeks gestation of pregnancy   . Encounter for fetal anatomic survey   . [redacted] weeks gestation of pregnancy   . Maternal age 2+, multigravida, antepartum   . Chest wall pain 07/29/2013  . Tooth pain 12/05/2012  . DERMOID CYST 05/06/2009    Assessment: Cassandra Lee is a 34 y.o. [id number] at [redacted]w[redacted]d here for SOL. Requests Sister in law to translate  #Labor:Progressing well #Pain: Natural, declines IV  pain meds or epidural #FWB: Category 1 tracing #ID:  GBS neg #MOF: breast #MOC: BTL, papers signed, family has #Circ:  declines  [name] 04/05/2015, 5:27 AM

## 2015-04-05 NOTE — Anesthesia Preprocedure Evaluation (Signed)
Anesthesia Evaluation  Patient identified by MRN, date of birth, ID band Patient awake    Reviewed: Allergy & Precautions, H&P , NPO status , Patient's Chart, lab work & pertinent test results  Airway Mallampati: I  TM Distance: >3 FB Neck ROM: full    Dental no notable dental hx.    Pulmonary neg pulmonary ROS,    Pulmonary exam normal        Cardiovascular negative cardio ROS Normal cardiovascular exam Rate:Normal     Neuro/Psych negative neurological ROS  negative psych ROS   GI/Hepatic negative GI ROS, Neg liver ROS,   Endo/Other  negative endocrine ROS  Renal/GU negative Renal ROS     Musculoskeletal   Abdominal Normal abdominal exam  (+)   Peds  Hematology   Anesthesia Other Findings   Reproductive/Obstetrics (+) Pregnancy                             Anesthesia Physical Anesthesia Plan  ASA: II  Anesthesia Plan: Spinal   Post-op Pain Management:    Induction:   Airway Management Planned:   Additional Equipment:   Intra-op Plan:   Post-operative Plan:   Informed Consent: I have reviewed the patients History and Physical, chart, labs and discussed the procedure including the risks, benefits and alternatives for the proposed anesthesia with the patient or authorized representative who has indicated his/her understanding and acceptance.     Plan Discussed with:   Anesthesia Plan Comments:         Anesthesia Quick Evaluation

## 2015-04-05 NOTE — Addendum Note (Signed)
Addendum  created 04/05/15 1729 by Hubbard Robinson, CRNA   Modules edited: Notes Section   Notes Section:  File: [id number]

## 2015-04-05 NOTE — Anesthesia Procedure Notes (Signed)
Spinal Patient location during procedure: OR Start time: 04/05/2015 9:49 AM End time: 04/05/2015 9:52 AM Staffing Anesthesiologist: Lyn Hollingshead Preanesthetic Checklist Completed: patient identified, surgical consent, pre-op evaluation, timeout performed, IV checked, risks and benefits discussed and monitors and equipment checked Spinal Block Patient position: sitting Prep: site prepped and draped and DuraPrep Patient monitoring: heart rate, cardiac monitor, continuous pulse ox and blood pressure Approach: midline Location: L3-4 Injection technique: single-shot Needle Needle type: Pencan  Needle gauge: 24 G Needle length: 9 cm Needle insertion depth: 4 cm Assessment Sensory level: T6

## 2015-04-05 NOTE — Op Note (Signed)
Cassandra Lee 04/05/2015  PREOPERATIVE DIAGNOSIS:  Undesired fertility  POSTOPERATIVE DIAGNOSIS:  Undesired fertility  PROCEDURE:  Postpartum Bilateral Tubal Sterilization using Filshie Clips   SURGEON:  Dr Loma Boston  ANESTHESIA:  Epidural  COMPLICATIONS:  None immediate.  ESTIMATED BLOOD LOSS:  Less than 20cc.   INDICATIONS: 34 y.o. yo [id number]  with undesired fertility, status post vaginal delivery, desires permanent sterilization. Risks and benefits of procedure discussed with patient including permanence of method, bleeding, infection, injury to surrounding organs and need for additional procedures. Risk failure of 0.5-1% with increased risk of ectopic gestation if pregnancy occurs was also discussed with patient.   FINDINGS:  Normal uterus, tubes, and ovaries.  TECHNIQUE:  The patient was taken to the operating room where her epidural anesthesia was dosed up to surgical level and found to be adequate.  She was then placed in the dorsal supine position and prepped and draped in sterile fashion.  After an adequate timeout was performed, a total of 15 ml 0.25% marcaine was infused subcutaneously around the planned incision site. Next, a small transverse skin incision was made under the umbilical fold. The incision was taken down to the layer of fascia using the scalpel, and fascia was incised, and extended bilaterally using Mayo scissors. The peritoneum was entered in a sharp fashion. Attention was then turned to the patient's uterus, and right fallopian tube was identified and followed out to the fimbriated end.  A Filshie clip was placed on the left fallopian tube about 2 cm from the cornual attachment, with care given to incorporate the underlying mesosalpinx.  A similar process was carried out on the left side allowing for bilateral tubal sterilization.  Good hemostasis was noted overall.  The instruments were then removed from the patient's abdomen and the fascial incision was repaired  with 0 Vicryl, and the skin was closed with a 4-0 Vicryl subcuticular stitch. The patient tolerated the procedure well.  Sponge, lap, and needle counts were correct times two.  The patient was then taken to the recovery room awake, extubated and in stable condition.   Gwynne Edinger, MD 04/05/2015 11:44 AM

## 2015-04-05 NOTE — Anesthesia Postprocedure Evaluation (Addendum)
Anesthesia Post Note  Patient: Cassandra Lee  Procedure(s) Performed: Procedure(s) (LRB): POST PARTUM TUBAL LIGATION (N/A)  Anesthesia type: Spinal  Patient location: PACU  Post pain: Pain level controlled  Post assessment: Post-op Vital signs reviewed  Last Vitals:  Filed Vitals:   04/05/15 1455  BP: 103/57  Pulse: 64  Temp: 36.8 C  Resp: 20    Post vital signs: Reviewed  Level of consciousness: awake  Complications: No apparent anesthesia complications

## 2015-04-05 NOTE — Progress Notes (Signed)
To OR for BLT, Baby to [facility]

## 2015-04-05 NOTE — Transfer of Care (Signed)
Immediate Anesthesia Transfer of Care Note  Patient: Cassandra Lee  Procedure(s) Performed: Procedure(s): POST PARTUM TUBAL LIGATION (N/A)  Patient Location: PACU  Anesthesia Type:Spinal  Level of Consciousness: awake, alert , oriented and patient cooperative  Airway & Oxygen Therapy: Patient Spontanous Breathing  Post-op Assessment: Report given to RN and Post -op Vital signs reviewed and stable  Post vital signs: Reviewed and stable  Last Vitals:  Filed Vitals:   04/05/15 1001  BP: 96/58  Pulse: 63  Temp: 37.2 C  Resp:     Complications: No apparent anesthesia complications

## 2015-04-05 NOTE — Progress Notes (Signed)
This note also relates to the following rows which could not be included: BP - Cannot attach notes to unvalidated device data Pulse Rate - Cannot attach notes to unvalidated device data   Dr Jillyn Hidden in room explaining anesthsia via interpreter.

## 2015-04-06 ENCOUNTER — Encounter (HOSPITAL_COMMUNITY): Payer: Self-pay | Admitting: Family Medicine

## 2015-04-06 DIAGNOSIS — Z302 Encounter for sterilization: Secondary | ICD-10-CM

## 2015-04-06 LAB — RPR: RPR: NONREACTIVE

## 2015-04-06 MED ORDER — IBUPROFEN 600 MG PO TABS: 600.0000 mg | ORAL_TABLET | Freq: Four times a day (QID) | ORAL | Status: DC | PRN

## 2015-04-06 NOTE — Discharge Instructions (Signed)
Remove bandage after 1 week. Then it is okay to let warm soapy water run over incision. Do not scrub incision until well healed. Do not lift more than 10 lbs

## 2015-04-06 NOTE — Discharge Summary (Signed)
OB Discharge Summary     Patient Name: Cassandra Lee DOB: 09-30-1980 MRN: [id number]  Date of admission: 04/05/2015 Delivering MD: Felicity Pellegrini L   Date of discharge: 04/06/2015  Admitting diagnosis: 25 WEEKS CTX desires sterilization Intrauterine pregnancy: [redacted]w[redacted]d     Secondary diagnosis: None     Discharge diagnosis: Term Pregnancy Delivered                                                                                                Post partum procedures:postpartum tubal ligation  Augmentation: none  Complications: None  Hospital course:  Onset of Labor With Vaginal Delivery     34 y.o. yo [id number] at [redacted]w[redacted]d was admitted in Active Laboron 04/05/2015. Patient had an uncomplicated labor course as follows:  Membrane Rupture Time/Date: 6:06 AM ,04/05/2015   Intrapartum Procedures: Episiotomy: None [1]                                         Lacerations:  None [1]  Mediations and procedures used include: N/A  Patient had a delivery of a Viable infant. 04/05/2015  Information for the patient's newborn:  Cassandra, [place] Lee [[id number]]  Delivery Method: Vaginal, Spontaneous Delivery (Filed from Delivery Summary)    Pateint had an uncomplicated postpartum course.  She is ambulating, tolerating a regular diet, passing flatus, and urinating well. Patient is discharged home in stable condition on No discharge date for patient encounter.Marland Kitchen    Physical exam  Filed Vitals:   04/05/15 1850 04/05/15 2104 04/06/15 0120 04/06/15 0600  BP: 106/55 87/52 83/48  86/50  Pulse: 62 61 55 64  Temp: 98.1 F (36.7 C) 98 F (36.7 C) 98 F (36.7 C) 98 F (36.7 C)  TempSrc: Oral     Resp: 18 20 18 20   Height:      Weight:      SpO2:       General: alert, cooperative and no distress Lochia: appropriate Uterine Fundus: firm Incision: Healing well with no significant drainage DVT Evaluation: No evidence of DVT seen on physical exam. Labs: Lab Results  Component Value Date   WBC 12.8* 04/05/2015   HGB 11.0* 04/05/2015   HCT 34.9* 04/05/2015   MCV 72.4* 04/05/2015   PLT 333 04/05/2015   CMP Latest Ref Rng 04/25/2009  Glucose 70 - 99 mg/dL 92  BUN 6 - 23 mg/dL 14  Creatinine 0.4 - 1.2 mg/dL 0.68  Sodium 135 - 145 mEq/L 134(L)  Potassium 3.5 - 5.1 mEq/L 3.1(L)  Chloride 96 - 112 mEq/L 112  CO2 19 - 32 mEq/L 15(L)  Calcium 8.4 - 10.5 mg/dL 8.4  Total Protein 6.0 - 8.3 g/dL 7.5  Total Bilirubin 0.3 - 1.2 mg/dL 0.7  Alkaline Phos 39 - 117 U/L 51  AST 0 - 37 U/L 23  ALT 0 - 35 U/L 21    Discharge instruction: per After Visit Summary and "Baby and Me Booklet".  Medications:  Current facility-administered medications:  .  acetaminophen (TYLENOL) tablet  650 mg, 650 mg, Oral, Q4H PRN, Donnamae Jude, MD, 650 mg at 04/06/15 0353 .  benzocaine-Menthol (DERMOPLAST) 20-0.5 % topical spray 1 application, 1 application, Topical, PRN, Donnamae Jude, MD .  witch hazel-glycerin (TUCKS) pad 1 application, 1 application, Topical, PRN **AND** dibucaine (NUPERCAINAL) 1 % rectal ointment 1 application, 1 application, Rectal, PRN, Donnamae Jude, MD .  diphenhydrAMINE (BENADRYL) capsule 25 mg, 25 mg, Oral, Q6H PRN, Donnamae Jude, MD .  ibuprofen (ADVIL,MOTRIN) tablet 600 mg, 600 mg, Oral, 4 times per day, Donnamae Jude, MD, 600 mg at 04/05/15 2327 .  lanolin ointment, , Topical, PRN, Donnamae Jude, MD .  ondansetron [facility]) tablet 4 mg, 4 mg, Oral, Q4H PRN **OR** ondansetron (ZOFRAN) injection 4 mg, 4 mg, Intravenous, Q4H PRN, Donnamae Jude, MD .  oxyCODONE-acetaminophen (PERCOCET/ROXICET) 5-325 MG per tablet 1 tablet, 1 tablet, Oral, Q4H PRN, Donnamae Jude, MD, 1 tablet at 04/06/15 0820 .  prenatal multivitamin tablet 1 tablet, 1 tablet, Oral, Q1200, Donnamae Jude, MD, 1 tablet at 04/05/15 1415 .  senna-docusate (Senokot-S) tablet 2 tablet, 2 tablet, Oral, Q24H, Donnamae Jude, MD, 2 tablet at 04/05/15 2327 .  simethicone (MYLICON) chewable tablet 80 mg, 80 mg, Oral, PRN, Donnamae Jude, MD .   zolpidem (AMBIEN) tablet 5 mg, 5 mg, Oral, QHS PRN, Donnamae Jude, MD  Diet: routine diet  Activity: Advance as tolerated. Pelvic rest for 6 weeks.   Outpatient follow up:6 weeks  Postpartum contraception: Tubal Ligation  Newborn Data: Live born female  Birth Weight: 7 lb 14.1 oz (3575 g) APGAR: 9, 9  Baby Feeding: Breast Disposition:home with mother   04/06/2015 Cassandra Lee   OB FELLOW DISCHARGE ATTESTATION  I have seen and examined this patient and agree with above documentation in the resident's note.    Desma Maxim, MD 8:59 AM

## 2015-05-12 ENCOUNTER — Encounter: Payer: Self-pay | Admitting: *Deleted

## 2017-02-15 ENCOUNTER — Ambulatory Visit (INDEPENDENT_AMBULATORY_CARE_PROVIDER_SITE_OTHER): Payer: Medicaid Other | Admitting: Family Medicine

## 2017-02-15 VITALS — BP 130/90 | HR 85 | Temp 98.5°F | Ht 60.0 in | Wt 105.0 lb

## 2017-02-15 DIAGNOSIS — R109 Unspecified abdominal pain: Secondary | ICD-10-CM

## 2017-02-15 LAB — POCT URINALYSIS DIP (MANUAL ENTRY)
BILIRUBIN UA: NEGATIVE mg/dL
Bilirubin, UA: NEGATIVE
Blood, UA: NEGATIVE
Glucose, UA: NEGATIVE mg/dL
Leukocytes, UA: NEGATIVE
Nitrite, UA: NEGATIVE
PH UA: 7.5 (ref 5.0–8.0)
Spec Grav, UA: 1.02 (ref 1.010–1.025)
UROBILINOGEN UA: 0.2 U/dL

## 2017-02-15 MED ORDER — RANITIDINE HCL 150 MG PO TABS: 150.0000 mg | ORAL_TABLET | Freq: Two times a day (BID) | ORAL | 11 refills | Status: DC

## 2017-02-15 NOTE — Patient Instructions (Signed)
It was a pleasure to see you today! Thank you for choosing [facility] for your primary care. Cassandra Lee was seen for new patient, abdominal pain.   Our plans for today were:  Take the acid medicine every day. Use tums if the pain continues.   You should return to our clinic to see Dr. Lindell Noe in 3 months for PAP smear.   Best,  Dr. Lindell Noe

## 2017-02-15 NOTE — Progress Notes (Signed)
   CC: new patient  HPI Abdominal pain - 1 month, feels like something is biting her (gnawing), internal, sometimes she feels she can't walk due to pain. Pain is somewhat improved with lying down. No meds. Constant. Still having menses, no association with this. Worse when she eats. BMs - normal, every day, no color change. No changes in urination. No fevers, no weight loss. No vaginal discharge.   [place], moved here ~10 years ago.  Referred by: children go here (10, almost 9, 5, 4, almost 2).   Medical history:  No meds daily  Surgical history: just BTL   Social history:  Lives with: 5 children Occupation: no job Tobacco use: never  Alcohol use: never Drug use: never  CC, SH/smoking status, and VS noted  Objective: BP 130/90   Pulse 85   Temp 98.5 F (36.9 C) (Oral)   Ht 5' (1.524 m)   Wt 105 lb (47.6 kg)   LMP 02/08/2017   BMI 20.51 kg/m  Gen: NAD, alert, cooperative, and pleasant thin female. HEENT: NCAT, EOMI, PERRL CV: RRR, no murmur Resp: CTAB, no wheezes, non-labored Abd: soft, TTP epigastric and suprapubic region, BS present, no guarding or organomegaly Ext: No edema, warm Neuro: Alert and oriented, Speech clear, No gross deficits  Assessment and plan:  Abdominal pain Diffuse, new. No previous evaluation of this. Reports this has been going on for one month. Both epigastric and suprapubic. UA without signs of infection. Will treat as GERD, and continue follow-up consider further abdominal imaging if no resolution with acid treatment. Additional differential includes IBD, menstrual cramps, pancreatitis. Given return precautions for the ED should she have worsening pain or inability to tolerate by mouth or changes in urination or bowel movements. Patient reports no sexual activity for several years, denies vaginal discharge, or deferred pelvic exam.   Orders Placed This Encounter  Procedures  . POCT urinalysis dipstick    Meds ordered this encounter    Medications  . ranitidine (ZANTAC) 150 MG tablet    Sig: Take 1 tablet (150 mg total) by mouth 2 (two) times daily.    Dispense:  60 tablet    Refill:  11   Health Maintenance: asked patient to schedule PAP smear  Ralene Ok, MD, PGY2 02/17/2017 11:41 PM

## 2017-02-17 DIAGNOSIS — R109 Unspecified abdominal pain: Secondary | ICD-10-CM | POA: Insufficient documentation

## 2017-02-17 NOTE — Assessment & Plan Note (Addendum)
Diffuse, new. No previous evaluation of this. Reports this has been going on for one month. Both epigastric and suprapubic. UA without signs of infection. Will treat as GERD, and continue follow-up consider further abdominal imaging if no resolution with acid treatment. Additional differential includes IBD, menstrual cramps, pancreatitis. Given return precautions for the ED should she have worsening pain or inability to tolerate by mouth or changes in urination or bowel movements. Patient reports no sexual activity for several years, denies vaginal discharge, or deferred pelvic exam.

## 2018-05-28 ENCOUNTER — Encounter (HOSPITAL_COMMUNITY): Payer: Self-pay | Admitting: Emergency Medicine

## 2018-05-28 ENCOUNTER — Ambulatory Visit (HOSPITAL_COMMUNITY)
Admission: EM | Admit: 2018-05-28 | Discharge: 2018-05-28 | Disposition: A | Discharge status: Home / Self Care | Payer: Medicaid Other | Attending: Family Medicine | Admitting: Family Medicine

## 2018-05-28 DIAGNOSIS — J0100 Acute maxillary sinusitis, unspecified: Secondary | ICD-10-CM

## 2018-05-28 DIAGNOSIS — R112 Nausea with vomiting, unspecified: Secondary | ICD-10-CM | POA: Diagnosis not present

## 2018-05-28 DIAGNOSIS — R05 Cough: Secondary | ICD-10-CM

## 2018-05-28 MED ORDER — FLUTICASONE PROPIONATE 50 MCG/ACT NA SUSP: 2.0000 | Freq: Every day | NASAL | 0 refills | Status: DC

## 2018-05-28 MED ORDER — FAMOTIDINE 20 MG PO TABS: 20.0000 mg | ORAL_TABLET | Freq: Two times a day (BID) | ORAL | 0 refills | Status: DC

## 2018-05-28 MED ORDER — ONDANSETRON 4 MG PO TBDP: 4.0000 mg | ORAL_TABLET | Freq: Three times a day (TID) | ORAL | 0 refills | Status: DC | PRN

## 2018-05-28 MED ORDER — ONDANSETRON 4 MG PO TBDP
4.0000 mg | ORAL_TABLET | Freq: Once | ORAL | Status: AC
  Administered 2018-05-28: 4 mg via ORAL

## 2018-05-28 MED ORDER — BENZONATATE 100 MG PO CAPS: 100.0000 mg | ORAL_CAPSULE | Freq: Three times a day (TID) | ORAL | 0 refills | Status: DC

## 2018-05-28 MED ORDER — ONDANSETRON 4 MG PO TBDP
ORAL_TABLET | ORAL | Status: AC
  Filled 2018-05-28: qty 1

## 2018-05-28 MED ORDER — IPRATROPIUM BROMIDE 0.06 % NA SOLN: 2.0000 | Freq: Four times a day (QID) | NASAL | 0 refills | Status: DC

## 2018-05-28 MED ORDER — DOXYCYCLINE HYCLATE 100 MG PO CAPS: 100.0000 mg | ORAL_CAPSULE | Freq: Two times a day (BID) | ORAL | 0 refills | Status: DC

## 2018-05-28 NOTE — ED Triage Notes (Signed)
Pt here with cough and URI sx; pt sts pain with cough and difficulty taking deep breath due to congestion

## 2018-05-28 NOTE — Discharge Instructions (Signed)
Zofran for nausea and vomiting as needed. You can take famotidine for possible acid reflux contributing to symptoms. Keep hydrated, you urine should be clear to pale yellow in color. Bland diet, advance as tolerated. Monitor for any worsening of symptoms, nausea or vomiting not controlled by medication, worsening abdominal pain, fever, follow-up for reevaluation.  Tessalon for cough. Start flonase, atrovent nasal spray for nasal congestion/drainage. You can use over the counter nasal saline rinse such as neti pot for nasal congestion. Keep hydrated, your urine should be clear to pale yellow in color. Tylenol  for fever and pain. Monitor for any worsening of symptoms, chest pain, shortness of breath, wheezing, swelling of the throat, follow up for reevaluation.   If nausea/vomiting/stomach pain improves, but continues with cough/congestion/facial pressure, you can fill doxycycline for sinus infection. If all symptoms are improving, you do not need to fill the antibiotic.   I have attached a heart center information, as discussed, you can start with your PCP first to assess your heart.

## 2018-05-28 NOTE — ED Provider Notes (Signed)
[place]    CSN: [id number] Arrival date & time: 05/28/18  0945     History   Chief Complaint Chief Complaint  Patient presents with  . Cough    HPI Cassandra Lee is a 37 y.o. female.   37 year old female comes in with family members for 2-day history of chest pain, back pain, shortness of breath.  She has had URI symptoms for the past 2 weeks, with cough, rhinorrhea, nasal congestion.  States with shortness of breath, partially due to congestion, partially due to chest pain.  She points to the mid chest and epigastric when asked about chest pain, this is constant, no aggravating or alleviating factor, and describes it as a pressure.  She has also had 4 episodes of nonbilious nonbloody vomit since yesterday, states that is when the pain from epigastric travels up to the chest, but has since then been constant.  Has not been able to assess association with food, as she is vomiting up water as well.  Denies fever, chills, night sweats.  Denies exertional fatigue, history of heart disease. Denies diarrhea.      Past Medical History:  Diagnosis Date  . Anemia     Patient Active Problem List   Diagnosis Date Noted  . Abdominal pain 02/17/2017  . Chest wall pain 07/29/2013  . Tooth pain 12/05/2012  . DERMOID CYST 05/06/2009    Past Surgical History:  Procedure Laterality Date  . TUBAL LIGATION N/A 04/05/2015   Procedure: POST PARTUM TUBAL LIGATION;  Surgeon: Truett Mainland, DO;  Location: [facility];  Service: Gynecology;  Laterality: N/A;    OB History    Gravida  6   Para  5   Term  5   Preterm      AB  1   Living  5     SAB  1   TAB      Ectopic      Multiple  0   Live Births  5            Home Medications    Prior to Admission medications   Medication Sig Start Date End Date Taking? Authorizing Provider  benzonatate (TESSALON) 100 MG capsule Take 1 capsule (100 mg total) by mouth every 8 (eight) hours. 05/28/18   Tasia Catchings, Nasiyah Laverdiere V, PA-C    doxycycline (VIBRAMYCIN) 100 MG capsule Take 1 capsule (100 mg total) by mouth 2 (two) times daily. 05/28/18   Tasia Catchings, Akira Perusse V, PA-C  famotidine (PEPCID) 20 MG tablet Take 1 tablet (20 mg total) by mouth 2 (two) times daily. 05/28/18   Tasia Catchings, Eduarda Scrivens V, PA-C  fluticasone (FLONASE) 50 MCG/ACT nasal spray Place 2 sprays into both nostrils daily. 05/28/18   Tasia Catchings, Kayra Crowell V, PA-C  ibuprofen (ADVIL,MOTRIN) 600 MG tablet Take 1 tablet (600 mg total) by mouth every 6 (six) hours as needed for moderate pain or cramping. 04/06/15   Donnamae Jude, MD  ipratropium (ATROVENT) 0.06 % nasal spray Place 2 sprays into both nostrils 4 (four) times daily. 05/28/18   Tasia Catchings, Kimothy Kishimoto V, PA-C  ondansetron (ZOFRAN ODT) 4 MG disintegrating tablet Take 1 tablet (4 mg total) by mouth every 8 (eight) hours as needed for nausea or vomiting. 05/28/18   Tasia Catchings, Airyonna Franklyn V, PA-C  prenatal vitamin w/FE, FA (NATACHEW) 29-1 MG CHEW Chew 1 tablet by mouth daily at 12 noon. May substituve PNV for patient preference. 12/05/12   Marin Olp, MD  senna-docusate (SENOKOT-S) 8.6-50 MG  per tablet Take 2 tablets by mouth daily. 04/22/13   Hilton Sinclair, MD    Family History Family History  Problem Relation Age of Onset  . Birth defects Neg Hx   . Early death Neg Hx   . Chromosomal disorder Neg Hx     Social History Social History   Tobacco Use  . Smoking status: Never Smoker  Substance Use Topics  . Alcohol use: No  . Drug use: No     Allergies   Patient has no known allergies.   Review of Systems Review of Systems  Reason unable to perform ROS: See HPI as above.     Physical Exam Triage Vital Signs ED Triage Vitals  Enc Vitals Group     BP 05/28/18 1008 (!) 146/105     Pulse Rate 05/28/18 1008 94     Resp 05/28/18 1008 16     Temp 05/28/18 1008 98.1 F (36.7 C)     Temp Source 05/28/18 1008 Oral     SpO2 05/28/18 1008 99 %     Weight --      Height --      Head Circumference --      Peak Flow --      Pain Score 05/28/18 1007  4     Pain Loc --      Pain Edu? --      Excl. in [place]? --    No data found.  Updated Vital Signs BP (!) 146/105 (BP Location: Left Arm)   Pulse 94   Temp 98.1 F (36.7 C) (Oral)   Resp 16   SpO2 99%   Physical Exam  Constitutional: She is oriented to person, place, and time. She appears well-developed and well-nourished. No distress.  HENT:  Head: Normocephalic and atraumatic.  Right Ear: Tympanic membrane, external ear and ear canal normal. Tympanic membrane is not erythematous and not bulging.  Left Ear: Tympanic membrane, external ear and ear canal normal. Tympanic membrane is not erythematous and not bulging.  Nose: Nose normal. Right sinus exhibits no maxillary sinus tenderness and no frontal sinus tenderness. Left sinus exhibits no maxillary sinus tenderness and no frontal sinus tenderness.  Mouth/Throat: Uvula is midline, oropharynx is clear and moist and mucous membranes are normal.  Eyes: Pupils are equal, round, and reactive to light. Conjunctivae are normal.  Neck: Normal range of motion. Neck supple.  Cardiovascular: Normal rate, regular rhythm and normal heart sounds. Exam reveals no gallop and no friction rub.  No murmur heard. Pulmonary/Chest: Effort normal and breath sounds normal. No stridor. No respiratory distress. She has no decreased breath sounds. She has no wheezes. She has no rhonchi. She has no rales.  Chest tenderness to palpation diffusely.   Abdominal: Soft. Bowel sounds are normal. There is no rebound and no guarding.  Epigastric tenderness to palpation.   Musculoskeletal:  Diffuse tenderness to palpation of thoracic back.  Lymphadenopathy:    She has no cervical adenopathy.  Neurological: She is alert and oriented to person, place, and time.  Skin: Skin is warm and dry.  Psychiatric: She has a normal mood and affect. Her behavior is normal. Judgment normal.    UC Treatments / Results  Labs (all labs ordered are listed, but only abnormal results  are displayed) Labs Reviewed - No data to display  EKG None  Radiology No results found.  Procedures Procedures (including critical care time)  Medications Ordered in UC Medications  ondansetron (ZOFRAN-ODT) disintegrating tablet 4  mg (4 mg Oral Given 05/28/18 1059)    Initial Impression / Assessment and Plan / UC Course  I have reviewed the triage vital signs and the nursing notes.  Pertinent labs & imaging results that were available during my care of the patient were reviewed by me and considered in my medical decision making (see chart for details).    Patient with improved symptoms after Zofran, able to tolerate fluids.  Discussed with patient, no alarming signs on exam.  EKG normal sinus rhythm, with sinus arrhythmia, 67 bpm, no acute ST changes.  Will provide Zofran for nausea/vomiting.  Famotidine for possible acid reflux. Discussed possible bacterial sinusitis causing symptoms as well. Given patient with nausea/vomiting, will defer abx for now. Will have patient start symptomatic treatment, can start doxycycline if URI symptoms not improving, and abdominal symptoms resolves. Return precautions given.  Final Clinical Impressions(s) / UC Diagnoses   Final diagnoses:  Intractable vomiting with nausea, unspecified vomiting type  Acute non-recurrent maxillary sinusitis    ED Prescriptions    Medication Sig Dispense Auth. Provider   ondansetron (ZOFRAN ODT) 4 MG disintegrating tablet Take 1 tablet (4 mg total) by mouth every 8 (eight) hours as needed for nausea or vomiting. 20 tablet Vertis Bauder V, PA-C   famotidine (PEPCID) 20 MG tablet Take 1 tablet (20 mg total) by mouth 2 (two) times daily. 30 tablet Keanen Dohse V, PA-C   benzonatate (TESSALON) 100 MG capsule Take 1 capsule (100 mg total) by mouth every 8 (eight) hours. 21 capsule Massimo Hartland V, PA-C   ipratropium (ATROVENT) 0.06 % nasal spray Place 2 sprays into both nostrils 4 (four) times daily. 15 mL Anjeanette Petzold V, PA-C   fluticasone  (FLONASE) 50 MCG/ACT nasal spray Place 2 sprays into both nostrils daily. 1 g Krislyn Donnan V, PA-C   doxycycline (VIBRAMYCIN) 100 MG capsule Take 1 capsule (100 mg total) by mouth 2 (two) times daily. 20 capsule Tobin Chad, [place] 05/28/18 1914

## 2019-06-18 ENCOUNTER — Ambulatory Visit (INDEPENDENT_AMBULATORY_CARE_PROVIDER_SITE_OTHER): Payer: Medicaid Other | Admitting: Family Medicine

## 2019-06-18 ENCOUNTER — Telehealth: Payer: Self-pay | Admitting: *Deleted

## 2019-06-18 ENCOUNTER — Other Ambulatory Visit: Payer: Self-pay

## 2019-06-18 VITALS — BP 118/78 | HR 97 | Ht 60.0 in | Wt 112.1 lb

## 2019-06-18 DIAGNOSIS — R1031 Right lower quadrant pain: Secondary | ICD-10-CM | POA: Diagnosis not present

## 2019-06-18 DIAGNOSIS — Z23 Encounter for immunization: Secondary | ICD-10-CM | POA: Diagnosis not present

## 2019-06-18 DIAGNOSIS — K219 Gastro-esophageal reflux disease without esophagitis: Secondary | ICD-10-CM

## 2019-06-18 DIAGNOSIS — M62830 Muscle spasm of back: Secondary | ICD-10-CM

## 2019-06-18 MED ORDER — FAMOTIDINE 20 MG PO TABS: 20.0000 mg | ORAL_TABLET | Freq: Every day | ORAL | 3 refills | Status: DC

## 2019-06-18 MED ORDER — ACETAMINOPHEN 500 MG PO TABS: 500.0000 mg | ORAL_TABLET | Freq: Four times a day (QID) | ORAL | 0 refills | Status: DC | PRN

## 2019-06-18 MED ORDER — IBUPROFEN 200 MG PO TABS: 200.0000 mg | ORAL_TABLET | Freq: Four times a day (QID) | ORAL | 0 refills | Status: DC | PRN

## 2019-06-18 MED ORDER — CYCLOBENZAPRINE HCL 5 MG PO TABS: 5.0000 mg | ORAL_TABLET | Freq: Every evening | ORAL | 1 refills | Status: DC | PRN

## 2019-06-18 NOTE — Patient Instructions (Addendum)
Thank you for coming in to see [place] today! Please see below to review our plan for today's visit:  1. Take Famotidine/Pepcid 10mg  every morning for the burning in your chest. 2. Take Flexeril/Cyclobenzaprine 5mg  at night when you have back pain to help relax your muscles. This medication can make you sleepy. Only take it at night. 3. We have collected Urine to make sure you're not having a bladder infection. I will call you with results. 4. An Ultrasound of your abdomen has been ordered to assess why you're having such bad pain in your low right abdomen. 5. You can take Ibuprofen 200mg  with Tylenol 500mg  every 6 hours as needed for pain. You can also use a Heating Pad to your stomach and back to help relax muscles. 6. You got your flu shot today!  Please call the clinic at [phone] if your symptoms worsen or you have any concerns. It was our pleasure to serve you!  Dr. Milus Banister [facility]

## 2019-06-18 NOTE — Telephone Encounter (Signed)
Pharmacy wants to verify that we only wanted to give #3 flexeril.  Please give them a call back. Christen Bame, CMA

## 2019-06-18 NOTE — Progress Notes (Signed)
   Subjective:    Patient ID: Cassandra Lee, female    DOB: [birth year], 38 y.o.   MRN: [id number]   CC: Back pain, abdominal pain, RLQ abdominal pain  HPI: Back pain: patient has had mid and low back pain that started about 1 month ago. The pain goes around to the front towards her right pelvis. The pain is slowly getting worse, is particularly worse at night. For pain control she has been trying over the counter mediations such as tylenol and advil. She denies any fevers, body aches, chills, chest pain, shortness of breath, falls or injuries, constipation, diarrhea, leg pain, or shooting pain into legs. She does endorse pain and burning with urination, however denies urinary frequency or urgency.   Burning in chest/Abdominal pain: the patient also reports intermittent burning in the epigastric region of her abdomen. Occasionally she feels burning that comes up her neck and can taste acid/burning in her throat. Denies any issues swallowing.   RLQ Pain: patient has been having some occasional RLQ pain that also started about 1 month ago. Has not found any medications that make the pain better. Her LMP was 05/25/2019 (told CMA it was 11/25). She is not currently sexually active, has not been in about 2 years since her husband passed away. No changes in diet, no injuries, no sick contacts. Also denies fevers, nausea, vomiting, shortness of breath, food intolerance, constipation, and diarrhea.   Smoking status reviewed - nonsmoker  Review of Systems - see HPI   Objective:  BP 118/78   Pulse 97   Ht 5' (1.524 m)   Wt 112 lb 2 oz (50.9 kg)   LMP 06/11/2019   SpO2 99%   BMI 21.90 kg/m  Vitals and nursing note reviewed  General: no apparent distress, healthy-appearing, pleasant individual Cardiac: RRR, clear S1 and S2 present, no murmurs appreciated Respiratory: CTA bilaterally, normal work of breathing Abdomen: soft, no CVA tenderness, no masses, normal bowel sounds appreciated; tenderness to  palpation appreciated in RLQ; +Obturator sign and +Psoas Extremities: no edema, deformity or cyanosis Skin: warm and dry, no rashes noted  Assessment & Plan:   Need for immunization against influenza Patient received flu shot today 06/18/2019  RLQ abdominal pain Patient with ~1 month of RLQ abdominal pain that is intermittent. +Psoas and Obturator signs. Patient denies fevers, no temp taken during appt. No N, V, D, or constipation. DDx concerning for appendicitis, ovarian cyst/mass/torsion, PID, endometriosis, UTI, musculoskeletal etiology, and abdominal abscess. Patient is a thin individual, no masses were appreciated. -Recommending complete abdomen ultrasound to rule out appendicitis. Will also order transvaginal ultrasound to rule out adnexal involvement. -Recommend heating pad to help ease any MSK cause of pain -Tylenol 500mg  and/or Ibuprofen 200mg .  -Order UA with reflex, culture as needed  Back muscle spasm Hypertonic paravertebral musculature appreciated to palpation on physical exam. No injury reported, no deformity appreciated. -Recommend heating pad to help reduce spasm -Also prescribing Flexeril 5mg  at night (only prescribing 3 tablets to see if patient tolerates them) -Ibuprofen/Tylenol as needed for pain  Gastroesophageal reflux disease Patient with epigastric burning and occasional acid reflux into her throat and back of mouth. -Famotidine daily for GERD -Avoid acidic foods -Stay upright for ~2 hours after eating meals, especially before going to bed    Return if symptoms worsen or fail to improve.   Dr. Milus Banister [facility], PGY-2

## 2019-06-19 DIAGNOSIS — R1031 Right lower quadrant pain: Secondary | ICD-10-CM | POA: Insufficient documentation

## 2019-06-19 DIAGNOSIS — M62830 Muscle spasm of back: Secondary | ICD-10-CM | POA: Insufficient documentation

## 2019-06-19 DIAGNOSIS — Z23 Encounter for immunization: Secondary | ICD-10-CM | POA: Insufficient documentation

## 2019-06-19 NOTE — Telephone Encounter (Signed)
I called them, thank you!

## 2019-06-19 NOTE — Assessment & Plan Note (Signed)
Patient received flu shot today 06/18/2019

## 2019-06-19 NOTE — Assessment & Plan Note (Addendum)
Patient with ~1 month of RLQ abdominal pain that is intermittent. +Psoas and Obturator signs. Patient denies fevers, no temp taken during appt. No N, V, D, or constipation. DDx concerning for appendicitis, ovarian cyst/mass/torsion, PID, endometriosis, UTI, musculoskeletal etiology, and abdominal abscess. Patient is a thin individual, no masses were appreciated. -Recommending complete abdomen ultrasound to rule out appendicitis. Will also order transvaginal ultrasound to rule out adnexal involvement. -Recommend heating pad to help ease any MSK cause of pain -Tylenol 500mg  and/or Ibuprofen 200mg .  -Order UA with reflex, culture as needed

## 2019-06-19 NOTE — Assessment & Plan Note (Signed)
Patient with epigastric burning and occasional acid reflux into her throat and back of mouth. -Famotidine daily for GERD -Avoid acidic foods -Stay upright for ~2 hours after eating meals, especially before going to bed

## 2019-06-19 NOTE — Assessment & Plan Note (Signed)
Hypertonic paravertebral musculature appreciated to palpation on physical exam. No injury reported, no deformity appreciated. -Recommend heating pad to help reduce spasm -Also prescribing Flexeril 5mg  at night (only prescribing 3 tablets to see if patient tolerates them) -Ibuprofen/Tylenol as needed for pain

## 2019-06-20 LAB — URINALYSIS, ROUTINE W REFLEX MICROSCOPIC
Bilirubin, UA: NEGATIVE
Glucose, UA: NEGATIVE
Ketones, UA: NEGATIVE
Leukocytes,UA: NEGATIVE
Nitrite, UA: NEGATIVE
Protein,UA: NEGATIVE
RBC, UA: NEGATIVE
Specific Gravity, UA: 1.006 (ref 1.005–1.030)
Urobilinogen, Ur: 0.2 mg/dL (ref 0.2–1.0)
pH, UA: 8 — ABNORMAL HIGH (ref 5.0–7.5)

## 2019-06-23 ENCOUNTER — Ambulatory Visit (HOSPITAL_COMMUNITY): Payer: Medicaid Other

## 2019-06-25 ENCOUNTER — Ambulatory Visit (HOSPITAL_COMMUNITY): Payer: Medicaid Other

## 2019-10-31 ENCOUNTER — Other Ambulatory Visit: Payer: Self-pay

## 2019-10-31 ENCOUNTER — Ambulatory Visit (HOSPITAL_COMMUNITY)
Admission: EM | Admit: 2019-10-31 | Discharge: 2019-10-31 | Disposition: A | Discharge status: Home / Self Care | Payer: Medicaid Other | Attending: Family Medicine | Admitting: Family Medicine

## 2019-10-31 ENCOUNTER — Encounter (HOSPITAL_COMMUNITY): Payer: Self-pay

## 2019-10-31 DIAGNOSIS — J302 Other seasonal allergic rhinitis: Secondary | ICD-10-CM | POA: Diagnosis not present

## 2019-10-31 DIAGNOSIS — J029 Acute pharyngitis, unspecified: Secondary | ICD-10-CM | POA: Diagnosis not present

## 2019-10-31 DIAGNOSIS — J019 Acute sinusitis, unspecified: Secondary | ICD-10-CM

## 2019-10-31 DIAGNOSIS — Z79899 Other long term (current) drug therapy: Secondary | ICD-10-CM | POA: Diagnosis not present

## 2019-10-31 DIAGNOSIS — K219 Gastro-esophageal reflux disease without esophagitis: Secondary | ICD-10-CM | POA: Insufficient documentation

## 2019-10-31 DIAGNOSIS — Z20822 Contact with and (suspected) exposure to covid-19: Secondary | ICD-10-CM | POA: Insufficient documentation

## 2019-10-31 MED ORDER — PREDNISONE 20 MG PO TABS: 20.0000 mg | ORAL_TABLET | Freq: Every day | ORAL | 0 refills | Status: AC

## 2019-10-31 MED ORDER — FLUTICASONE PROPIONATE 50 MCG/ACT NA SUSP: 1.0000 | Freq: Every day | NASAL | 0 refills | Status: DC

## 2019-10-31 MED ORDER — AMOXICILLIN-POT CLAVULANATE 875-125 MG PO TABS: 1.0000 | ORAL_TABLET | Freq: Two times a day (BID) | ORAL | 0 refills | Status: AC

## 2019-10-31 MED ORDER — OLOPATADINE HCL 0.1 % OP SOLN: 1.0000 [drp] | Freq: Two times a day (BID) | OPHTHALMIC | 0 refills | Status: DC

## 2019-10-31 MED ORDER — CETIRIZINE HCL 10 MG PO CAPS: 10.0000 mg | ORAL_CAPSULE | Freq: Every day | ORAL | 0 refills | Status: DC

## 2019-10-31 NOTE — ED Triage Notes (Signed)
Pt reports having nasal congestion, eyes are itching, headaches  and sore throat x 3 weeks.

## 2019-10-31 NOTE — Discharge Instructions (Signed)
Covid test pending, monitor my chart for results Please try using cetirizine/Zyrtec daily as alternative to loratadine/Claritin Flonase nasal spray 1 to 2 spray in each nostril daily Olopatadine/Pataday eyedrops with itching twice daily in eyes Prednisone 20 mg twice daily with food for the next 5 days Given length of her symptoms I am also going to cover for infection, begin Augmentin twice daily for the next week  Please continue to monitor symptoms, follow-up if any symptoms not improving or worsening

## 2019-10-31 NOTE — ED Provider Notes (Signed)
[place]    CSN: [id number] Arrival date & time: 10/31/19  [id number]      History   Chief Complaint Chief Complaint  Patient presents with  . Sore Throat  . Nasal Congestion  . Eye Problem    HPI Cassandra Lee is a 39 y.o. female history of tubal ligation presenting today for evaluation of URI symptoms.  Patient notes that over the past 3 weeks she has had nasal congestion, itchy and watery eyes, headaches and sore throat.  She denies any fevers chills or body aches.  Denies any nausea vomiting or diarrhea.  Does report some abdominal pain, but reports that this has been going on for a while.  She has been using Claritin and Clear Eyes without relief of symptoms.  HPI  Past Medical History:  Diagnosis Date  . Anemia     Patient Active Problem List   Diagnosis Date Noted  . Need for immunization against influenza 06/19/2019  . RLQ abdominal pain 06/19/2019  . Back muscle spasm 06/19/2019  . Abdominal pain 02/17/2017  . Chest wall pain 07/29/2013  . Tooth pain 12/05/2012  . 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 Gastroesophageal reflux disease 01/20/2009    Past Surgical History:  Procedure Laterality Date  . TUBAL LIGATION N/A 04/05/2015   Procedure: POST PARTUM TUBAL LIGATION;  Surgeon: Truett Mainland, DO;  Location: [facility];  Service: Gynecology;  Laterality: N/A;    OB History    Gravida  6   Para  5   Term  5   Preterm      AB  1   Living  5     SAB  1   TAB      Ectopic      Multiple  0   Live Births  5            Home Medications    Prior to Admission medications   Medication Sig Start Date End Date Taking? Authorizing Provider  loratadine (CLARITIN) 10 MG tablet Take 10 mg by mouth daily.   Yes [provider]  naphazoline-glycerin (CLEAR EYES REDNESS) 0.012-0.2 % SOLN 1-2 drops 4 (four) times daily as needed for eye irritation.   Yes [provider]  amoxicillin-clavulanate (AUGMENTIN) 875-125 MG tablet Take 1 tablet  by mouth every 12 (twelve) hours for 7 days. 10/31/19 11/07/19  Emrys Mckamie C, PA-C  Cetirizine HCl 10 MG CAPS Take 1 capsule (10 mg total) by mouth daily. 10/31/19   Inella Kuwahara C, PA-C  fluticasone (FLONASE) 50 MCG/ACT nasal spray Place 1-2 sprays into both nostrils daily for 7 days. 10/31/19 11/07/19  Swathi Dauphin C, PA-C  olopatadine (PATANOL) 0.1 % ophthalmic solution Place 1 drop into both eyes 2 (two) times daily. 10/31/19   Ferguson Gertner C, PA-C  predniSONE (DELTASONE) 20 MG tablet Take 1 tablet (20 mg total) by mouth daily for 5 days. 10/31/19 11/05/19  Czar Ysaguirre C, PA-C  famotidine (PEPCID) 20 MG tablet Take 1 tablet (20 mg total) by mouth daily. 06/18/19 10/31/19  Milus Banister C, DO  ipratropium (ATROVENT) 0.06 % nasal spray Place 2 sprays into both nostrils 4 (four) times daily. 05/28/18 10/31/19  Ok Edwards, PA-C    Family History Family History  Problem Relation Age of Onset  . Birth defects Neg Hx   . Early death Neg Hx   . Chromosomal disorder Neg Hx     Social History Social History   Tobacco Use  .  Smoking status: Never Smoker  . Smokeless tobacco: Never Used  Substance Use Topics  . Alcohol use: No  . Drug use: No     Allergies   Patient has no known allergies.   Review of Systems Review of Systems  Constitutional: Negative for activity change, appetite change, chills, fatigue and fever.  HENT: Positive for congestion, rhinorrhea and sore throat. Negative for ear pain, sinus pressure and trouble swallowing.   Eyes: Positive for redness and itching. Negative for discharge.  Respiratory: Negative for cough, chest tightness and shortness of breath.   Cardiovascular: Negative for chest pain.  Gastrointestinal: Negative for abdominal pain, diarrhea, nausea and vomiting.  Musculoskeletal: Negative for myalgias.  Skin: Negative for rash.  Neurological: Negative for dizziness, light-headedness and headaches.     Physical Exam Triage Vital  Signs ED Triage Vitals  Enc Vitals Group     BP 10/31/19 0956 139/87     Pulse Rate 10/31/19 0956 81     Resp 10/31/19 0956 16     Temp 10/31/19 0956 99.2 F (37.3 C)     Temp Source 10/31/19 0956 Oral     SpO2 10/31/19 0956 100 %     Weight --      Height --      Head Circumference --      Peak Flow --      Pain Score 10/31/19 0953 8     Pain Loc --      Pain Edu? --      Excl. in [place]? --    No data found.  Updated Vital Signs BP 139/87 (BP Location: Left Arm)   Pulse 81   Temp 99.2 F (37.3 C) (Oral)   Resp 16   LMP 10/09/2019 (Exact Date)   SpO2 100%   Visual Acuity Right Eye Distance:   Left Eye Distance:   Bilateral Distance:    Right Eye Near:   Left Eye Near:    Bilateral Near:     Physical Exam Vitals and nursing note reviewed.  Constitutional:      Appearance: She is well-developed.     Comments: No acute distress  HENT:     Head: Normocephalic and atraumatic.     Ears:     Comments: Bilateral ears without tenderness to palpation of external auricle, tragus and mastoid, EAC's without erythema or swelling, TM's with good bony landmarks and cone of light. Non erythematous.     Nose: Nose normal.     Mouth/Throat:     Comments: Oral mucosa pink and moist, no tonsillar enlargement or exudate. Posterior pharynx patent and nonerythematous, no uvula deviation or swelling. Normal phonation.  Eyes:     Extraocular Movements: Extraocular movements intact.     Conjunctiva/sclera: Conjunctivae normal.     Pupils: Pupils are equal, round, and reactive to light.     Comments: Bilateral eyes appear slightly erythematous, no periorbital swelling or erythema  Cardiovascular:     Rate and Rhythm: Normal rate.  Pulmonary:     Effort: Pulmonary effort is normal. No respiratory distress.     Comments: Breathing comfortably at rest, CTABL, no wheezing, rales or other adventitious sounds auscultated Abdominal:     General: There is no distension.     Comments: Soft,  nondistended, tender to palpation to periumbilical area and mid lower quadrant, mild tenderness to epigastrium, negative rebound, negative Rovsing  Musculoskeletal:        General: Normal range of motion.     Cervical  back: Neck supple.  Skin:    General: Skin is warm and dry.  Neurological:     Mental Status: She is alert and oriented to person, place, and time.      UC Treatments / Results  Labs (all labs ordered are listed, but only abnormal results are displayed) Labs Reviewed  SARS CORONAVIRUS 2 (TAT 6-24 HRS)    EKG   Radiology No results found.  Procedures Procedures (including critical care time)  Medications Ordered in UC Medications - No data to display  Initial Impression / Assessment and Plan / UC Course  I have reviewed the triage vital signs and the nursing notes.  Pertinent labs & imaging results that were available during my care of the patient were reviewed by me and considered in my medical decision making (see chart for details).     URI symptoms x3 weeks, most likely allergic rhinitis given associated eye symptoms, will switch to Zyrtec as alternative to Claritin, adding in Flonase and olopatadine.  Prednisone 20 mg twice daily x5 days.  Given length of symptoms will also cover for sinusitis with Augmentin.  Continue to rest and drink plenty of fluids.  Covid PCR pending, but less suspicious of this.  Discussed strict return precautions. Patient verbalized understanding and is agreeable with plan.  Final Clinical Impressions(s) / UC Diagnoses   Final diagnoses:  Seasonal allergic rhinitis, unspecified trigger  Acute sinusitis with symptoms > 10 days     Discharge Instructions     Covid test pending, monitor my chart for results Please try using cetirizine/Zyrtec daily as alternative to loratadine/Claritin Flonase nasal spray 1 to 2 spray in each nostril daily Olopatadine/Pataday eyedrops with itching twice daily in eyes Prednisone 20 mg  twice daily with food for the next 5 days Given length of her symptoms I am also going to cover for infection, begin Augmentin twice daily for the next week  Please continue to monitor symptoms, follow-up if any symptoms not improving or worsening    ED Prescriptions    Medication Sig Dispense Auth. Provider   amoxicillin-clavulanate (AUGMENTIN) 875-125 MG tablet Take 1 tablet by mouth every 12 (twelve) hours for 7 days. 14 tablet Kai Calico C, PA-C   predniSONE (DELTASONE) 20 MG tablet Take 1 tablet (20 mg total) by mouth daily for 5 days. 5 tablet Kana Reimann C, PA-C   fluticasone (FLONASE) 50 MCG/ACT nasal spray Place 1-2 sprays into both nostrils daily for 7 days. 1 g Ronell Boldin C, PA-C   Cetirizine HCl 10 MG CAPS Take 1 capsule (10 mg total) by mouth daily. 20 capsule Julea Hutto C, PA-C   olopatadine (PATANOL) 0.1 % ophthalmic solution Place 1 drop into both eyes 2 (two) times daily. 5 mL Tanasha Menees, [place] C, PA-C     PDMP not reviewed this encounter.   Janith Lima, PA-C 10/31/19 1040

## 2019-11-01 LAB — SARS CORONAVIRUS 2 (TAT 6-24 HRS): SARS Coronavirus 2: NEGATIVE

## 2020-07-01 NOTE — Patient Instructions (Addendum)
   Thank you for coming to see me today. It was a pleasure. I do not know what the cause of your pain is. Lets do the pelvic ultrasound and it will tell us more. In the mean time you can take tylenol for the pain. For the burning pain I have prescribed pepcid which is an antacid.   Please follow-up after your ultrasound results.   If you have any questions or concerns, please do not hesitate to call the office at [phone].  If you develop fevers>100.5, shortness of breath, chest pain, palpitations, dizziness, abdominal pain, nausea, vomiting, diarrhea or cannot eat or drink then please go to the ER immediately.  Best wishes,   Dr Posey Pronto

## 2020-07-05 ENCOUNTER — Ambulatory Visit (INDEPENDENT_AMBULATORY_CARE_PROVIDER_SITE_OTHER): Payer: Medicaid Other | Admitting: Family Medicine

## 2020-07-05 ENCOUNTER — Other Ambulatory Visit: Payer: Self-pay

## 2020-07-05 ENCOUNTER — Encounter: Payer: Self-pay | Admitting: Family Medicine

## 2020-07-05 VITALS — BP 120/78 | HR 100 | Ht 60.0 in | Wt 115.8 lb

## 2020-07-05 DIAGNOSIS — R1030 Lower abdominal pain, unspecified: Secondary | ICD-10-CM

## 2020-07-05 DIAGNOSIS — R1031 Right lower quadrant pain: Secondary | ICD-10-CM

## 2020-07-05 LAB — POCT URINALYSIS DIP (MANUAL ENTRY)
Bilirubin, UA: NEGATIVE
Blood, UA: NEGATIVE
Glucose, UA: NEGATIVE mg/dL
Ketones, POC UA: NEGATIVE mg/dL
Nitrite, UA: NEGATIVE
Protein Ur, POC: NEGATIVE mg/dL
Spec Grav, UA: 1.015 (ref 1.010–1.025)
Urobilinogen, UA: 0.2 E.U./dL
pH, UA: 7.5 (ref 5.0–8.0)

## 2020-07-05 LAB — POCT URINE PREGNANCY: Preg Test, Ur: NEGATIVE

## 2020-07-05 LAB — POCT UA - MICROSCOPIC ONLY

## 2020-07-05 MED ORDER — FAMOTIDINE 40 MG PO TABS: 40.0000 mg | ORAL_TABLET | Freq: Every day | ORAL | 0 refills | Status: DC

## 2020-07-05 NOTE — Progress Notes (Addendum)
     SUBJECTIVE:   CHIEF COMPLAINT / HPI:   A [place] speaking interpretor was using for the entirety of this encounter.   Cassandra Lee is a 39 y.o. female presents for follow up for abdominal pain   Abdominal pain Patient endorses RLQ and right sided back pain which started on 2 months ago. Onset was gradual with not radiation. Feels a" lump growing inside". Described as sharp pain. No alleviating factors and has tried Tylenol. Exacerbated by movement. Severity 9/10. Denies having this pain before. Does endorse heartburn. Denies weight loss, eating well, nausea, vomiting,fevers, dysuria, hematuria or bowel symptoms.  Gyn hx LMP 6th December. Regular cycles every month, last 3-7 days. Denies being sexually active. Denies abnormal bleeding from vagina. Has 5 children all born SVD. BLT 5 years ago.   Health maintenance  Pap: overdue  PERTINENT  PMH / PSH: GERD, dermoid cyst, tubal ligation   OBJECTIVE:   BP 120/78   Pulse 100   Ht 5' (1.524 m)   Wt 115 lb 12.8 oz (52.5 kg)   LMP 06/16/2020   SpO2 99%   BMI 22.62 kg/m    General: Alert, no acute distress Cardio: Normal S1 and S2, RRR, no r/m/g Pulm: CTAB, normal work of breathing Abdomen: Bowel sounds normal. Abdomen non distended, tender in epigastrium and RLQ. No guarding. Extremities: No peripheral edema.  Neuro: Cranial nerves grossly intact   ASSESSMENT/PLAN:   RLQ abdominal pain Different is broad including ovarian cyst/torsion, UTI, complication from BLT, ectopic pregnancy etc. UA: small leuks. U preg negative. Vital signs wnl which is reassuring. Per chart review patient was seen in clinic for the same issue 1 year ago but did not follow up for pelvic US.  -Pelvic abdominal and TV US -PCP F/u after [place] results, at this time patient needs pap smear -Tylenol PRN for pain -Pepcid for GERD sx     Lattie Haw, MD PGY-2 [place]

## 2020-07-05 NOTE — Assessment & Plan Note (Addendum)
Different is broad including ovarian cyst/torsion, UTI, complication from BLT, ectopic pregnancy etc. UA: small leuks. U preg negative. Vital signs wnl which is reassuring. Per chart review patient was seen in clinic for the same issue 1 year ago but did not follow up for pelvic US.  -Pelvic abdominal and TV US -PCP F/u after [place] results, at this time patient needs pap smear -Tylenol PRN for pain -Pepcid for GERD sx

## 2020-07-22 ENCOUNTER — Ambulatory Visit
Admission: RE | Admit: 2020-07-22 | Discharge: 2020-07-22 | Disposition: A | Discharge status: Home / Self Care | Payer: Medicaid Other | Source: Ambulatory Visit | Attending: Family Medicine | Admitting: Family Medicine

## 2020-07-22 DIAGNOSIS — R1030 Lower abdominal pain, unspecified: Secondary | ICD-10-CM

## 2020-07-22 NOTE — Progress Notes (Addendum)
     SUBJECTIVE:   CHIEF COMPLAINT / HPI:   Cassandra Lee is a 40 y.o. 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 LLQ pain is 9/10 severity. Taking famotidine, motrin and tylenol for the pain. 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 Does have dizziness sometimes. Denies abnormal vaginal bleeding but has some vaginal discharge.  Denies nausea, vomiting or fevers. LMP 30th December  PERTINENT  PMH / PSH: GERD, dermoid tumor, bilateral tubal ligation  OBJECTIVE:   BP 118/72   Pulse 85   Ht 5' (1.524 m)   Wt 113 lb 9.6 oz (51.5 kg)   LMP 07/15/2020   SpO2 99%   BMI 22.19 kg/m    General: Alert, no acute distress Cardio: Normal S1 and S2, RRR, no r/m/g Pulm: CTAB, normal work of breathing Abdomen: Bowel sounds normal. Abdomen soft, nondistended, significant tenderness in lower pelvis bilaterally, no guarding.  Extremities: No peripheral edema.  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 Per chart review patient had this on OB ultrasound 12 years ago but this was small in size cyst measuring 2.3 x 8 3.0 x 2.0 cm.It is now 5 cm in size.  Patient does have some associated dizziness however is well-appearing and hemodynamically stable.  Pelvic exam revealed: bloody vaginal discharge and blood surrounding the cervix and moderate tenderness on internal exam.  Unlikely to be ovarian torsion and  pain is likely due to size of tumor.  Obtained U. Preg today and GC chlamydia swabs.  Patient has appointment with Gynecology tomorrow at 1:50 PM.  I have provided her with these details.  Safety return precautions provided to patient.     Towanda Octave, MD PGY-2 [facility]

## 2020-07-23 ENCOUNTER — Other Ambulatory Visit: Payer: Self-pay | Admitting: Family Medicine

## 2020-07-23 DIAGNOSIS — D369 Benign neoplasm, unspecified site: Secondary | ICD-10-CM

## 2020-07-24 ENCOUNTER — Telehealth: Payer: Self-pay | Admitting: Family Medicine

## 2020-07-24 NOTE — Telephone Encounter (Signed)
Called patient and her son. We will unable to get a hold of a Mike Gip interpretor so her son translated for her. I explained that she has a tumor on the left ovary and that she should go to the ER if she develops worsening abdominal pain, dizziness etc as the tumour can cause ovarian torsion. Son and patient expressed understanding. I reminded them of the appointment she has on 12th Jan at 10:10 and we can discuss this further then. Patient said she will be present at this appointment.   Lattie Haw MD   PGY-2

## 2020-07-28 ENCOUNTER — Other Ambulatory Visit (HOSPITAL_COMMUNITY)
Admission: RE | Admit: 2020-07-28 | Discharge: 2020-07-28 | Disposition: A | Discharge status: Home / Self Care | Payer: Medicaid Other | Source: Ambulatory Visit | Attending: Family Medicine | Admitting: Family Medicine

## 2020-07-28 ENCOUNTER — Other Ambulatory Visit: Payer: Self-pay

## 2020-07-28 ENCOUNTER — Ambulatory Visit (INDEPENDENT_AMBULATORY_CARE_PROVIDER_SITE_OTHER): Payer: Medicaid Other | Admitting: Family Medicine

## 2020-07-28 ENCOUNTER — Encounter: Payer: Self-pay | Admitting: Family Medicine

## 2020-07-28 VITALS — BP 118/72 | HR 85 | Ht 60.0 in | Wt 113.6 lb

## 2020-07-28 DIAGNOSIS — N898 Other specified noninflammatory disorders of vagina: Secondary | ICD-10-CM | POA: Insufficient documentation

## 2020-07-28 DIAGNOSIS — D369 Benign neoplasm, unspecified site: Secondary | ICD-10-CM | POA: Diagnosis not present

## 2020-07-28 LAB — POCT URINE PREGNANCY: Preg Test, Ur: NEGATIVE

## 2020-07-28 NOTE — Patient Instructions (Addendum)
Thank you for coming to see me today. It was a pleasure. 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 We have referred you to gynecology-[facility]. [address] first [address]. Tel: [phone]. You will be seen tomorrow at 1:50pm.  If you have any questions or concerns, please do not hesitate to call the office at [phone].  If you develop fevers>100.5, shortness of breath, chest pain, palpitations, dizziness, abdominal pain, nausea, vomiting, diarrhea or cannot eat or drink then please go to the ER immediately.  Best wishes,   Dr Posey Pronto

## 2020-07-28 NOTE — Assessment & Plan Note (Addendum)
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 Per chart review patient had this on OB ultrasound 12 years ago but this was small in size cyst measuring 2.3 x 8 3.0 x 2.0 cm.It is now 5 cm in size.  Patient does have some associated dizziness however is well-appearing and hemodynamically stable.  Pelvic exam revealed: bloody vaginal discharge and blood surrounding the cervix and moderate tenderness on internal exam.  Unlikely to be ovarian torsion and pain is likely due to size of tumor.  Obtained U. Preg today and GC chlamydia swabs.  Patient has appointment with Gynecology tomorrow at 1:50 PM.  I have provided her with these details.  Safety return precautions provided to patient.

## 2020-07-29 ENCOUNTER — Encounter: Payer: Self-pay | Admitting: Obstetrics & Gynecology

## 2020-07-29 ENCOUNTER — Ambulatory Visit (INDEPENDENT_AMBULATORY_CARE_PROVIDER_SITE_OTHER): Payer: Medicaid Other | Admitting: Obstetrics & Gynecology

## 2020-07-29 ENCOUNTER — Other Ambulatory Visit (HOSPITAL_COMMUNITY)
Admission: RE | Admit: 2020-07-29 | Discharge: 2020-07-29 | Disposition: A | Discharge status: Home / Self Care | Payer: Medicaid Other | Source: Ambulatory Visit | Attending: Obstetrics & Gynecology | Admitting: Obstetrics & Gynecology

## 2020-07-29 VITALS — BP 135/92 | HR 79 | Ht 59.0 in | Wt 112.0 lb

## 2020-07-29 DIAGNOSIS — Z124 Encounter for screening for malignant neoplasm of cervix: Secondary | ICD-10-CM

## 2020-07-29 DIAGNOSIS — R1031 Right lower quadrant pain: Secondary | ICD-10-CM | POA: Diagnosis not present

## 2020-07-29 DIAGNOSIS — D369 Benign neoplasm, unspecified site: Secondary | ICD-10-CM

## 2020-07-29 DIAGNOSIS — K219 Gastro-esophageal reflux disease without esophagitis: Secondary | ICD-10-CM

## 2020-07-29 LAB — CERVICOVAGINAL ANCILLARY ONLY
Chlamydia: NEGATIVE
Comment: NEGATIVE
Comment: NORMAL
Neisseria Gonorrhea: NEGATIVE

## 2020-07-29 MED ORDER — PANTOPRAZOLE SODIUM 40 MG PO TBEC: 40.0000 mg | DELAYED_RELEASE_TABLET | Freq: Every day | ORAL | 1 refills | Status: DC

## 2020-07-29 NOTE — Addendum Note (Signed)
Addended by: Shelly Coss on: 07/29/2020 03:51 PM   Modules accepted: Orders

## 2020-07-29 NOTE — Patient Instructions (Signed)
Ovarian Cyst  An ovarian cyst is a fluid-filled sac on an ovary. Most of these cysts go away on their own and are not cancer. Some cysts need treatment. What are the causes?  Ovarian hyperstimulation syndrome. Some medicines may lead to this problem.  Polycystic ovarian syndrome (PCOS). Problems with body chemicals (hormones) can lead to this condition.  The normal menstrual cycle. What increases the risk?  Being overweight or very overweight.  Taking medicines to increase your chance of getting pregnant.  Using some types of birth control.  Smoking. What are the signs or symptoms? Many ovarian cysts do not cause symptoms. If you get symptoms, you may have:  Pain or pressure in the area between the hip bones.  Pain in the lower belly.  Pain during sex.  Swelling in the lower belly.  Periods that are not regular.  Pain with periods. How is this treated? Many ovarian cysts go away on their own without treatment. If you need treatment, it may include:  Medicines for pain.  Fluid taken out of the cyst.  The cyst being taken out.  Birth control pills or other medicines.  Surgery to remove the ovary. Follow these instructions at home:  Take over-the-counter and prescription medicines only as told by your doctor.  Ask your doctor if you should avoid driving or using machines while you are taking your medicine.  Get exams and Pap tests as told by your doctor.  Return to your normal activities when your doctor says that it is safe.  Do not smoke or use any products that contain nicotine or tobacco. If you need help quitting, ask your doctor.  Keep all follow-up visits. Contact a doctor if:  Your periods: ? Are late. ? Are not regular. ? Stop. ? Are painful.  You have pain in the area between your hip bones, and the pain does not go away.  You feel pressure on your bladder.  You have trouble peeing.  You feel full, or your belly hurts, swells, or  bloats.  You gain or lose weight without trying, or you are less hungry than normal.  You feel pain and pressure in your back.  You feel pain and pressure in the area between your hip bones.  You think you may be pregnant. Get help right away if:  You have pain in your belly that is very bad or gets worse.  You have pain in the area between your hip bones, and the pain is very bad or gets worse.  You cannot eat or drink without vomiting.  You get a fever or chills all of a sudden.  Your period is a lot heavier than usual. Summary  An ovarian cyst is a fluid-filled sac on an ovary.  Some cysts may cause problems and need treatment.  Most of these cysts go away on their own. This information is not intended to replace advice given to you by your health care provider. Make sure you discuss any questions you have with your health care provider. Document Revised: 12/11/2019 Document Reviewed: 12/11/2019 Elsevier Patient Education  2021 Elsevier Inc.  

## 2020-07-29 NOTE — Progress Notes (Signed)
Patient ID: Cassandra Lee, female   DOB: June 16, 1981, 40 y.o.   MRN: [id number]  Chief Complaint  Patient presents with  . dermoid cyst    HPI Cassandra Lee is a 40 y.o. female.  [id number] Patient's last menstrual period was 07/15/2020. RLQ pain and epigastric pain and reflux for several weeks. She has had a left ovary dermoid diagnosed several years ago, no c/o LLQ pain. Occasional constipation. HPI  Past Medical History:  Diagnosis Date  . Anemia     Past Surgical History:  Procedure Laterality Date  . TUBAL LIGATION N/A 04/05/2015   Procedure: POST PARTUM TUBAL LIGATION;  Surgeon: Truett Mainland, DO;  Location: [facility];  Service: Gynecology;  Laterality: N/A;    Family History  Problem Relation Age of Onset  . Birth defects Neg Hx   . Early death Neg Hx   . Chromosomal disorder Neg Hx     Social History Social History   Tobacco Use  . Smoking status: Never Smoker  . Smokeless tobacco: Never Used  Substance Use Topics  . Alcohol use: No  . Drug use: No    No Known Allergies  Current Outpatient Medications  Medication Sig Dispense Refill  . pantoprazole (PROTONIX) 40 MG tablet Take 1 tablet (40 mg total) by mouth daily. 30 tablet 1  . fluticasone (FLONASE) 50 MCG/ACT nasal spray Place 1-2 sprays into both nostrils daily for 7 days. 1 g 0   No current facility-administered medications for this visit.    Review of Systems Review of Systems  Constitutional: Negative.   Cardiovascular: Negative.   Gastrointestinal: Positive for abdominal pain and constipation.       Reflux not resolved with Pepcid  Genitourinary: Positive for pelvic pain and vaginal discharge. Negative for dysuria and menstrual problem.    Blood pressure (!) 135/92, pulse 79, height 4\' 11"  (1.499 m), weight 112 lb (50.8 kg), last menstrual period 07/15/2020, not currently breastfeeding.  Physical Exam Physical Exam Exam conducted with a chaperone present.  Constitutional:      Appearance: Normal  appearance. She is not ill-appearing.  Pulmonary:     Effort: Pulmonary effort is normal.  Abdominal:     General: Abdomen is flat.     Palpations: Abdomen is soft. There is no mass.     Tenderness: There is no abdominal tenderness.  Genitourinary:    General: Normal vulva.     Vagina: Normal.     Cervix: Normal.     Uterus: Normal.      Adnexa: Right adnexa normal.       Left: Mass (5 cm mobile smooth) and tenderness (very  mild) present.   Neurological:     Mental Status: She is alert.     Data Reviewed CLINICAL DATA:  RIGHT lower quadrant abdominal pain; LMP 07/15/2020  EXAM: TRANSABDOMINAL ULTRASOUND OF PELVIS  TECHNIQUE: Transabdominal ultrasound examination of the pelvis was performed including evaluation of the uterus, ovaries, adnexal regions, and pelvic cul-de-sac. Transvaginal imaging was not ordered.  COMPARISON:  None  FINDINGS: Uterus  Measurements: 8.0 x 4.3 x 4.3 cm = volume: 62 mL. Anteverted. Normal morphology without mass  Endometrium  Thickness: 2 mm.  No endometrial fluid or focal abnormality  Right ovary  Not visualized, likely obscured by bowel  Left ovary  Measurements: 6.5 x 3.6 x 3.9 cm = volume: 48 mL. 5.2 x 4.1 x 4.5 cm diameter complicated cystic lesion within LEFT ovary, containing a hypoechoic component as well as and  eccentric hyperechoic nodular focus 3.8 cm in greatest size. 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  Other findings: No free pelvic fluid. No additional adnexal masses.  IMPRESSION: Unremarkable uterus and endometrial complex.  Nonvisualization of RIGHT ovary.  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  Per BPR protocol based on size, characterization of this lesion by MR imaging with and without contrast recommended.   Electronically Signed   By: Cassandra Lee M.D.   On: 07/22/2020 16:14 Results for  Cassandra Lee (MRN [id number]) as of 07/29/2020 15:08  Ref. 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 MR PELVIS W CONTRAST    Standing Status:   Future    Standing Expiration Date:   09/26/2020    Order Specific Question:   If indicated for the ordered procedure, I authorize the administration of contrast media per Radiology protocol    Answer:   Yes    Order Specific Question:   What is the patient's sedation requirement?    Answer:   No Sedation    Order Specific Question:   Does the patient have a pacemaker or implanted devices?    Answer:   No    Order Specific Question:   Preferred imaging location?    Answer:   [facility] (table limit - 500 lbs)  . CA 125  . Comprehensive metabolic panel  . Amylase   RTC after testing Meds ordered this encounter  Medications  . pantoprazole (PROTONIX) 40 MG tablet    Sig: Take 1 tablet (40 mg total) by mouth daily.    Dispense:  30 tablet    Refill:  1       Emeterio Reeve 07/29/2020, 3:09 PM

## 2020-07-30 LAB — COMPREHENSIVE METABOLIC PANEL
ALT: 14 IU/L (ref 0–32)
AST: 13 IU/L (ref 0–40)
Albumin/Globulin Ratio: 1.6 (ref 1.2–2.2)
Albumin: 5 g/dL — ABNORMAL HIGH (ref 3.8–4.8)
Alkaline Phosphatase: 75 IU/L (ref 44–121)
BUN/Creatinine Ratio: 12 (ref 9–23)
BUN: 7 mg/dL (ref 6–24)
Bilirubin Total: 0.6 mg/dL (ref 0.0–1.2)
CO2: 24 mmol/L (ref 20–29)
Calcium: 9.8 mg/dL (ref 8.7–10.2)
Chloride: 103 mmol/L (ref 96–106)
Creatinine, Ser: 0.59 mg/dL (ref 0.57–1.00)
GFR calc Af Amer: 131 mL/min/{1.73_m2} (ref >59)
GFR calc non Af Amer: 113 mL/min/{1.73_m2} (ref >59)
Globulin, Total: 3.1 g/dL (ref 1.5–4.5)
Glucose: 84 mg/dL (ref 65–99)
Potassium: 4.2 mmol/L (ref 3.5–5.2)
Sodium: 139 mmol/L (ref 134–144)
Total Protein: 8.1 g/dL (ref 6.0–8.5)

## 2020-07-30 LAB — CYTOLOGY - PAP
Comment: NEGATIVE
Diagnosis: NEGATIVE
High risk HPV: NEGATIVE

## 2020-07-30 LAB — CA 125: Cancer Antigen (CA) 125: 18.6 U/mL (ref 0.0–38.1)

## 2020-07-30 LAB — AMYLASE: Amylase: 67 U/L (ref 31–110)

## 2020-08-01 ENCOUNTER — Other Ambulatory Visit: Payer: Self-pay | Admitting: Family Medicine

## 2020-08-06 ENCOUNTER — Telehealth: Payer: Self-pay

## 2020-08-06 NOTE — Telephone Encounter (Signed)
-----   Message from Woodroe Mode, MD sent at 07/30/2020  2:36 PM EST ----- Negative testing so far

## 2020-08-06 NOTE — Telephone Encounter (Signed)
Per [employer], there was no [place] interpreter available. Can communicate negative test results at next appointment.

## 2020-08-16 ENCOUNTER — Ambulatory Visit (HOSPITAL_COMMUNITY): Admission: RE | Admit: 2020-08-16 | Payer: Medicaid Other | Source: Ambulatory Visit

## 2020-08-25 ENCOUNTER — Other Ambulatory Visit: Payer: Self-pay

## 2020-08-25 ENCOUNTER — Encounter: Payer: Self-pay | Admitting: Obstetrics & Gynecology

## 2020-08-25 ENCOUNTER — Ambulatory Visit (INDEPENDENT_AMBULATORY_CARE_PROVIDER_SITE_OTHER): Payer: Medicaid Other | Admitting: Obstetrics & Gynecology

## 2020-08-25 VITALS — BP 126/84 | HR 77 | Ht 61.0 in | Wt 112.2 lb

## 2020-08-25 DIAGNOSIS — M62830 Muscle spasm of back: Secondary | ICD-10-CM

## 2020-08-25 DIAGNOSIS — D369 Benign neoplasm, unspecified site: Secondary | ICD-10-CM

## 2020-08-25 DIAGNOSIS — K219 Gastro-esophageal reflux disease without esophagitis: Secondary | ICD-10-CM

## 2020-08-25 DIAGNOSIS — R1031 Right lower quadrant pain: Secondary | ICD-10-CM

## 2020-08-25 NOTE — Progress Notes (Signed)
Patient presents for f/u but did not have the MRI that was requested for the ovarian mass. CA 125 was normal. No improvement from taking protonix for reflux, to be addressed by PCP. R/S MRI and f/u in 4-6 weeks. Her questions were answered, live interpreter assisted  Woodroe Mode, MD 08/25/2020

## 2020-08-25 NOTE — Patient Instructions (Signed)
Ovarian Cyst  An ovarian cyst is a fluid-filled sac on an ovary. Most of these cysts go away on their own and are not cancer. Some cysts need treatment. What are the causes?  Ovarian hyperstimulation syndrome. Some medicines may lead to this problem.  Polycystic ovarian syndrome (PCOS). Problems with body chemicals (hormones) can lead to this condition.  The normal menstrual cycle. What increases the risk?  Being overweight or very overweight.  Taking medicines to increase your chance of getting pregnant.  Using some types of birth control.  Smoking. What are the signs or symptoms? Many ovarian cysts do not cause symptoms. If you get symptoms, you may have:  Pain or pressure in the area between the hip bones.  Pain in the lower belly.  Pain during sex.  Swelling in the lower belly.  Periods that are not regular.  Pain with periods. How is this treated? Many ovarian cysts go away on their own without treatment. If you need treatment, it may include:  Medicines for pain.  Fluid taken out of the cyst.  The cyst being taken out.  Birth control pills or other medicines.  Surgery to remove the ovary. Follow these instructions at home:  Take over-the-counter and prescription medicines only as told by your doctor.  Ask your doctor if you should avoid driving or using machines while you are taking your medicine.  Get exams and Pap tests as told by your doctor.  Return to your normal activities when your doctor says that it is safe.  Do not smoke or use any products that contain nicotine or tobacco. If you need help quitting, ask your doctor.  Keep all follow-up visits. Contact a doctor if:  Your periods: ? Are late. ? Are not regular. ? Stop. ? Are painful.  You have pain in the area between your hip bones, and the pain does not go away.  You feel pressure on your bladder.  You have trouble peeing.  You feel full, or your belly hurts, swells, or  bloats.  You gain or lose weight without trying, or you are less hungry than normal.  You feel pain and pressure in your back.  You feel pain and pressure in the area between your hip bones.  You think you may be pregnant. Get help right away if:  You have pain in your belly that is very bad or gets worse.  You have pain in the area between your hip bones, and the pain is very bad or gets worse.  You cannot eat or drink without vomiting.  You get a fever or chills all of a sudden.  Your period is a lot heavier than usual. Summary  An ovarian cyst is a fluid-filled sac on an ovary.  Some cysts may cause problems and need treatment.  Most of these cysts go away on their own. This information is not intended to replace advice given to you by your health care provider. Make sure you discuss any questions you have with your health care provider. Document Revised: 12/11/2019 Document Reviewed: 12/11/2019 Elsevier Patient Education  2021 Elsevier Inc.  

## 2020-09-10 ENCOUNTER — Ambulatory Visit (HOSPITAL_COMMUNITY)
Admission: RE | Admit: 2020-09-10 | Discharge: 2020-09-10 | Disposition: A | Discharge status: Home / Self Care | Payer: Medicaid Other | Source: Ambulatory Visit | Attending: Obstetrics & Gynecology | Admitting: Obstetrics & Gynecology

## 2020-09-10 ENCOUNTER — Other Ambulatory Visit: Payer: Self-pay

## 2020-09-10 DIAGNOSIS — D369 Benign neoplasm, unspecified site: Secondary | ICD-10-CM | POA: Insufficient documentation

## 2020-09-10 DIAGNOSIS — R1031 Right lower quadrant pain: Secondary | ICD-10-CM | POA: Insufficient documentation

## 2020-09-10 DIAGNOSIS — N838 Other noninflammatory disorders of ovary, fallopian tube and broad ligament: Secondary | ICD-10-CM | POA: Diagnosis not present

## 2020-09-10 MED ORDER — GADOBUTROL 1 MMOL/ML IV SOLN
5.0000 mL | Freq: Once | INTRAVENOUS | Status: AC | PRN
  Administered 2020-09-10: 5 mL via INTRAVENOUS

## 2020-09-15 NOTE — Progress Notes (Signed)
Bilateral ovarian dermoids, we will discuss at her f/u appointment in 3 weeks

## 2020-10-07 ENCOUNTER — Ambulatory Visit (INDEPENDENT_AMBULATORY_CARE_PROVIDER_SITE_OTHER): Payer: Medicaid Other | Admitting: Obstetrics & Gynecology

## 2020-10-07 ENCOUNTER — Encounter: Payer: Self-pay | Admitting: Obstetrics & Gynecology

## 2020-10-07 ENCOUNTER — Other Ambulatory Visit: Payer: Self-pay

## 2020-10-07 VITALS — BP 133/88 | HR 87 | Wt 111.6 lb

## 2020-10-07 DIAGNOSIS — D369 Benign neoplasm, unspecified site: Secondary | ICD-10-CM | POA: Diagnosis not present

## 2020-10-07 NOTE — Patient Instructions (Signed)
Diagnostic Laparoscopy Diagnostic laparoscopy is a procedure to diagnose problems in the abdomen. It might be done for a variety of reasons, such as to look for scar tissue, a reason for abdominal pain, an abdominal mass or tumor, or fluid in the abdomen (ascites). This procedure may also be done to remove a tissue sample from the liver to look at under a microscope (biopsy). During the procedure, a thin, flexible tube that has a light and a camera on the end (laparoscope) is inserted through a small incision in the abdomen. The image from the camera is shown on a monitor to help the surgeon see inside the body. Tell a health care provider about:  Any allergies you have.  All medicines you are taking, including vitamins, herbs, eye drops, creams, and over-the-counter medicines.  Any problems you or family members have had with anesthetic medicines.  Any blood disorders you have.  Any surgeries you have had.  Any medical conditions you have.  Whether you are pregnant or may be pregnant. What are the risks? Generally, this is a safe procedure. However, problems may occur, including:  Infection.  Bleeding.  Allergic reactions to medicines or dyes.  Damage to abdominal structures or organs, such as the intestines, liver, stomach, or spleen. What happens before the procedure? Staying hydrated Follow instructions from your health care provider about hydration, which may include:  Up to 2 hours before the procedure - you may continue to drink clear liquids, such as water, clear fruit juice, black coffee, and plain tea.   Eating and drinking restrictions Follow instructions from your health care provider about eating and drinking, which may include:  8 hours before the procedure - stop eating heavy meals or foods, such as meat, fried foods, or fatty foods.  6 hours before the procedure - stop eating light meals or foods, such as toast or cereal.  6 hours before the procedure - stop  drinking milk or drinks that contain milk.  2 hours before the procedure - stop drinking clear liquids. Medicines Ask your health care provider about:  Changing or stopping your regular medicines. This is especially important if you are taking diabetes medicines or blood thinners.  Taking medicines such as aspirin and ibuprofen. These medicines can thin your blood. Do not take these medicines unless your health care provider tells you to take them.  Taking over-the-counter medicines, vitamins, herbs, and supplements. General instructions  Ask your health care provider: ? How your surgery site will be marked. ? What steps will be taken to help prevent infection. These steps may include:  Removing hair at the surgery site.  Washing skin with a germ-killing soap.  Taking antibiotic medicine.  Plan to have a responsible adult take you home from the hospital or clinic.  Plan to have a responsible adult care for you for the time you are told after you leave the hospital or clinic. This is important. What happens during the procedure?  An IV will be inserted into one of your veins.  You will be given one or more of the following: ? A medicine to help you relax (sedative). ? A medicine to numb the area (local anesthetic). ? A medicine to make you fall asleep (general anesthetic).  A breathing tube will be placed down your throat to help you breathe during the procedure.  Your abdomen will be filled with an air-like gas so that your abdomen expands. This will give the surgeon more room to operate and will make  your organs easier to see.  Many small incisions will be made in your abdomen.  A laparoscope and other surgical instruments will be inserted into your abdomen through these incisions.  A biopsy may be done. This will depend on the reason why you are having this procedure.  The laparoscope and other instruments will be removed from your abdomen.  The air-like gas will be  released from your abdomen.  Your incisions will be closed with stitches (sutures), skin glue, or surgical tapes and covered with a bandage (dressing).  Your breathing tube will be removed. The procedure may vary among health care providers and hospitals.   What happens after the procedure?  Your blood pressure, heart rate, breathing rate, and blood oxygen level will be monitored until you leave the hospital or clinic.  If you were given a sedative during the procedure, it can affect you for several hours. Do not drive or operate machinery until your health care provider says that it is safe.  It is up to you to get the results of your procedure. Ask your health care provider, or the department that is doing the procedure, when your results will be ready. Summary  Diagnostic laparoscopy is a procedure to diagnose problems in the abdomen using a thin, flexible tube that has a light and a camera on the end (laparoscope).  Follow instructions from your health care provider about how to prepare for the procedure.  Plan to have a responsible adult care for you for the time you are told after you leave the hospital or clinic. This is important. This information is not intended to replace advice given to you by your health care provider. Make sure you discuss any questions you have with your health care provider. Document Revised: 02/27/2020 Document Reviewed: 02/27/2020 Elsevier Patient Education  2021 [employer].

## 2020-10-07 NOTE — Progress Notes (Signed)
Patient ID: Cassandra Lee, female   DOB: 02/24/1981, 40 y.o.   MRN: [id number]  Chief Complaint  Patient presents with  . Follow-up    HPI Cassandra Lee is a 40 y.o. female.  [id number] Patient's last menstrual period was 09/28/2020. Patient still has RLQ pain and she comes today for f/u after MRI on 2/25. The imaging showed bilateral ovarian dermoids. HPI  Past Medical History:  Diagnosis Date  . Anemia     Past Surgical History:  Procedure Laterality Date  . TUBAL LIGATION N/A 04/05/2015   Procedure: POST PARTUM TUBAL LIGATION;  Surgeon: Truett Mainland, DO;  Location: [facility];  Service: Gynecology;  Laterality: N/A;    Family History  Problem Relation Age of Onset  . Birth defects Neg Hx   . Early death Neg Hx   . Chromosomal disorder Neg Hx     Social History Social History   Tobacco Use  . Smoking status: Never Smoker  . Smokeless tobacco: Never Used  Substance Use Topics  . Alcohol use: No  . Drug use: No    No Known Allergies  Current Outpatient Medications  Medication Sig Dispense Refill  . famotidine (PEPCID) 40 MG tablet TAKE 1 TABLET(40 MG) BY MOUTH DAILY (Patient not taking: No sig reported) 30 tablet 0  . fluticasone (FLONASE) 50 MCG/ACT nasal spray Place 1-2 sprays into both nostrils daily for 7 days. 1 g 0  . pantoprazole (PROTONIX) 40 MG tablet Take 1 tablet (40 mg total) by mouth daily. (Patient not taking: Reported on 10/07/2020) 30 tablet 1   No current facility-administered medications for this visit.    Review of Systems Review of Systems  Constitutional: Negative.   Respiratory: Negative.   Gastrointestinal: Negative.   Genitourinary: Positive for pelvic pain. Negative for vaginal bleeding and vaginal discharge.    Blood pressure 133/88, pulse 87, weight 111 lb 9.6 oz (50.6 kg), last menstrual period 09/28/2020.  Physical Exam Physical Exam Vitals and nursing note reviewed.  Constitutional:      Appearance: Normal appearance. She is not  ill-appearing.  Pulmonary:     Effort: Pulmonary effort is normal.  Neurological:     Mental Status: She is alert.  Psychiatric:        Mood and Affect: Mood normal.        Behavior: Behavior normal.     Data Reviewed Narrative & Impression  CLINICAL DATA:  5.2 cm complicated left adnexal cyst on recent ultrasound exam. Patient history of left ovarian dermoid diagnosed by ultrasound on 04/30/2009. Now with right lower quadrant pain.  EXAM: MRI PELVIS WITHOUT AND WITH CONTRAST  TECHNIQUE: Multiplanar multisequence MR imaging of the pelvis was performed both before and after administration of intravenous contrast.  CONTRAST:  75mL GADAVIST GADOBUTROL 1 MMOL/ML IV SOLN  COMPARISON:  Ultrasound exam 07/22/2020  FINDINGS: Urinary Tract: The urinary bladder appears normal for the degree of distention.  Bowel:  No bowel dilatation within the visualized pelvis.  Vascular/Lymphatic: No pathologically enlarged lymph nodes. No significant vascular abnormality seen.  Reproductive: Uterus has a somewhat globular configuration and is position to the right of midline, measuring 6.7 x 4.5 x 4.4 cm junctional zone preserved.  6.0 x 5.0 x 6.4 cm well-defined lesion is identified in the right adnexal space. Postcontrast T1 axial image 10 of series 10 shows ovarian parenchyma with follicles immediately posterior to this lesion with claw sign consistent with right ovarian origin. Comparing T1 imaging without fat suppression (image 13/series 6)  to T1 imaging with fat suppression (image 13/series 7) this lesion shows diffuse loss of signal intensity on fat suppressed imaging consistent with mainly fat composition. There is some heterogeneous signal intensity in the cranial aspect of this mass which is positioned immediately anterior to a small focus of susceptibility artifact, likely tubal ligation clip. There is some heterogeneous enhancement of the tissue in the cranial aspect  of this lesion. Given the diffuse loss of signal intensity on fat suppressed T1 imaging, lesion is most compatible with ovarian dermoid.  4.8 x 3.6 x 4.4 cm well-defined lesion in the left adnexal space has smooth margins without wall irregularity. This lesion also shows diffuse loss of signal intensity on fat suppressed T1 imaging, compatible with mainly fat composition. There is a irregular nodular component of enhancing soft tissue posteromedially in the lesion and also a peripheral focus of susceptibility artifact along the medial wall of the lesion suggesting calcification. Apparent tubal ligation clip noted just cranial to this abnormality. As on the right, imaging features of this lesion are most compatible with ovarian dermoid.  Other:  No intraperitoneal free fluid.  Musculoskeletal: No focal suspicious marrow enhancement within the visualized bony anatomy.  IMPRESSION: 1. 6.4 x 5.0 x 6.4 cm well-defined lesion in the right adnexal space shows mainly fat composition and is most compatible with right ovarian dermoid. 2. 4.8 x 3.6 x 4.4 cm well-defined lesion in the left adnexal space shows mainly fat composition, also most compatible with a benign ovarian dermoid. This lesion was identified on ultrasound dating as far back as 04/30/2009 measuring 2.3 x 3.0 x 2.0 cm at that time. 3. No acute findings in the pelvis.   Electronically Signed   By: Misty Stanley M.D.   On: 09/13/2020 08:38      Assessment Bilateral ovarian dermoid cysts Pelvic pain  Plan Schedule L/S bilateral ovarian cystectomy as OP. Possible oophorectomy. Patient desires surgical management.  The risks of surgery were discussed in detail with the patient including but not limited to: bleeding which may require transfusion or reoperation; infection which may require prolonged hospitalization or re-hospitalization and antibiotic therapy; injury to bowel, bladder, ureters and major vessels or  other surrounding organs; formation of adhesions; need for additional procedures including laparotomy or subsequent procedures secondary to abnormal pathology; thromboembolic phenomenon; incisional problems and other postoperative or anesthesia complications.  Patient was told that the likelihood that her condition and symptoms will be treated effectively with this surgical management was very high; the postoperative expectations were also discussed in detail. The patient also understands the alternative treatment options which were discussed in full. All questions were answered.  She was told that she will be contacted by our surgical scheduler regarding the time and date of her surgery; routine preoperative instructions will be given to her by the preoperative nursing team.   She is aware of need for preoperative COVID testing and subsequent quarantine from time of test to time of surgery; she will be given further preoperative instructions at that [place] screening visit.  Printed patient education handouts about the procedure were given to the patient to review at home.    Emeterio Reeve 10/07/2020, 10:30 AM

## 2020-10-07 NOTE — Progress Notes (Signed)
Patient complains of constant pelvic pain, states that she deals with this pain everyday and it is a sharp "poking" type of pain. Also, complains of lower back pain. Patient said that she is not taking anything for the pain.   Cassandra Lee, CMA

## 2020-10-08 ENCOUNTER — Encounter: Payer: Self-pay | Admitting: *Deleted

## 2020-10-13 ENCOUNTER — Telehealth: Payer: Self-pay | Admitting: *Deleted

## 2020-10-13 ENCOUNTER — Encounter: Payer: Self-pay | Admitting: *Deleted

## 2020-10-13 NOTE — Telephone Encounter (Signed)
Call to [place] to communicate surgery information to patient.  Surgery has been moved up to 11-10-20 at 1215 at [facility]. This is a change from initial letter sent.   Interpretor will call back when available to place call.

## 2020-10-13 NOTE — Telephone Encounter (Signed)
Suin from [place] provided translation of message to patients voice mail. Left message advising letter sent with 1st surgery date.  Have now been able to move surgery up to 4-27-22at 1210. Arrive at 1000 at [facility]. She will receive call with additional information.  Updated letter and map mailed.

## 2020-10-15 NOTE — Telephone Encounter (Signed)
Attempt to contact patient to review surgery information.  Interpretor not currently available.

## 2020-10-26 NOTE — Telephone Encounter (Signed)
Call from patient emergency contact- Diane. She received message from [place].  Advised of surgery date, time, location.  Need to coordinate translator for day of surgery and pre-op instructions. Phone number to Pre- Anesthesia department at [facility] given.  Diane has contact number for office for any additional needs.

## 2020-10-26 NOTE — Telephone Encounter (Signed)
Per Janett Billow through [employer], Interpretor '[place]" conference call to patient. Voice mail is full and unable to leave message. Not active on My Chart. Call to [place], emergency contact with interpretor- Emogene Morgan left message for Diane to have patient call regarding upcoming surgery. Emogene Morgan [phone]

## 2020-10-26 NOTE — Telephone Encounter (Signed)
Call to Language [employer] for [employer]. No one available at this time. Left message requesting interpretor for a patient call. Want to confirm she has received letter ( In [place], so can she understand it?) regarding surgery on 11-10-20.

## 2020-11-04 NOTE — Telephone Encounter (Signed)
Follow-up call to patient's emergency contact, Diane.  Left message calling to confirm she was able to reach patient and proceed with scheduled procedure.  Request call back to [phone].

## 2020-11-04 NOTE — Telephone Encounter (Signed)
Scheduled for surgery 11-10-20 with Dr Roselie Awkward. Dr Roselie Awkward unavailable due to family emergency. Call to patient's contact, Diane. Left 2nd message (called earlier today) requesting confirm surgery and is willing tp proceed with Dr Harolyn Rutherford, partner in practice.  Request call back to [phone].

## 2020-11-04 NOTE — Telephone Encounter (Signed)
Call back from [place]. She reports she has confirmed with patient agreeable to proceed as scheduled with Dr Harolyn Rutherford. Due to language barrier, patient does not answer phone. Advised that translator will be available on phone when we call.  Diane reports they have contacted Pre-Op nurse at hospital and waiting on return call.   Encounter closed.

## 2020-11-05 ENCOUNTER — Encounter (HOSPITAL_BASED_OUTPATIENT_CLINIC_OR_DEPARTMENT_OTHER): Payer: Self-pay | Admitting: Obstetrics & Gynecology

## 2020-11-05 ENCOUNTER — Other Ambulatory Visit: Payer: Self-pay

## 2020-11-05 NOTE — Progress Notes (Signed)
Spoke w/ via phone for pre-op interview---pt sisiter in law hjiem ayum and pt due to patient speks montgnard jarai Lab needs dos---- urine poct (per anesthesia) surgery orders pending              Lab results------none COVID test ------11-08-2020 1100 Arrive at -------1025 am 11-10-2020 NPO after MN NO1 Solid Food.  Water  from MN until---900 am then npo Med rec completed Medications to take morning of surgery -----none Diabetic medication -----n/a Patient instructed to bring photo id and insurance card day of surgery Patient aware to have Driver (ride ) / caregiver  Sister in law    for 24 hours after surgery  Patient Special Instructions -----none Pre-Op special Istructions -----surgery orders req from dr anywu epic ib Patient verbalized understanding of instructions that were given at this phone interview. Patient denies shortness of breath, chest pain, fever, cough at this phone interview.  Sister in law hjiem ayum to interpret day of surgery, release to be signed

## 2020-11-08 ENCOUNTER — Other Ambulatory Visit (HOSPITAL_COMMUNITY)
Admission: RE | Admit: 2020-11-08 | Discharge: 2020-11-08 | Disposition: A | Discharge status: Home / Self Care | Payer: Medicaid Other | Source: Ambulatory Visit | Attending: Obstetrics & Gynecology | Admitting: Obstetrics & Gynecology

## 2020-11-08 DIAGNOSIS — Z01812 Encounter for preprocedural laboratory examination: Secondary | ICD-10-CM | POA: Insufficient documentation

## 2020-11-08 DIAGNOSIS — Z20822 Contact with and (suspected) exposure to covid-19: Secondary | ICD-10-CM | POA: Insufficient documentation

## 2020-11-09 LAB — SARS CORONAVIRUS 2 (TAT 6-24 HRS): SARS Coronavirus 2: NEGATIVE

## 2020-11-10 ENCOUNTER — Ambulatory Visit (HOSPITAL_BASED_OUTPATIENT_CLINIC_OR_DEPARTMENT_OTHER): Payer: Medicaid Other | Admitting: Anesthesiology

## 2020-11-10 ENCOUNTER — Encounter (HOSPITAL_BASED_OUTPATIENT_CLINIC_OR_DEPARTMENT_OTHER): Payer: Self-pay | Admitting: Obstetrics & Gynecology

## 2020-11-10 ENCOUNTER — Ambulatory Visit (HOSPITAL_COMMUNITY)
Admission: RE | Admit: 2020-11-10 | Discharge: 2020-11-10 | Disposition: A | Discharge status: Home / Self Care | Payer: Medicaid Other | Attending: Obstetrics & Gynecology | Admitting: Obstetrics & Gynecology

## 2020-11-10 ENCOUNTER — Encounter (HOSPITAL_BASED_OUTPATIENT_CLINIC_OR_DEPARTMENT_OTHER)
Admission: RE | Disposition: A | Discharge status: Home / Self Care | Payer: Self-pay | Source: Home / Self Care | Attending: Obstetrics & Gynecology

## 2020-11-10 ENCOUNTER — Other Ambulatory Visit: Payer: Self-pay

## 2020-11-10 DIAGNOSIS — K660 Peritoneal adhesions (postprocedural) (postinfection): Secondary | ICD-10-CM | POA: Insufficient documentation

## 2020-11-10 DIAGNOSIS — D270 Benign neoplasm of right ovary: Secondary | ICD-10-CM

## 2020-11-10 DIAGNOSIS — D630 Anemia in neoplastic disease: Secondary | ICD-10-CM | POA: Diagnosis not present

## 2020-11-10 DIAGNOSIS — D271 Benign neoplasm of left ovary: Secondary | ICD-10-CM | POA: Diagnosis not present

## 2020-11-10 DIAGNOSIS — N736 Female pelvic peritoneal adhesions (postinfective): Secondary | ICD-10-CM | POA: Diagnosis not present

## 2020-11-10 DIAGNOSIS — R109 Unspecified abdominal pain: Secondary | ICD-10-CM | POA: Diagnosis present

## 2020-11-10 HISTORY — PX: LAPAROSCOPIC OVARIAN CYSTECTOMY: SHX6248

## 2020-11-10 HISTORY — DX: Gastro-esophageal reflux disease without esophagitis: K21.9

## 2020-11-10 LAB — CBC
HCT: 43.3 % (ref 36.0–46.0)
Hemoglobin: 13.3 g/dL (ref 12.0–15.0)
MCH: 22.4 pg — ABNORMAL LOW (ref 26.0–34.0)
MCHC: 30.7 g/dL (ref 30.0–36.0)
MCV: 72.9 fL — ABNORMAL LOW (ref 80.0–100.0)
Platelets: 394 10*3/uL (ref 150–400)
RBC: 5.94 MIL/uL — ABNORMAL HIGH (ref 3.87–5.11)
RDW: 14 % (ref 11.5–15.5)
WBC: 7.2 10*3/uL (ref 4.0–10.5)
nRBC: 0 % (ref 0.0–0.2)

## 2020-11-10 LAB — POCT PREGNANCY, URINE: Preg Test, Ur: NEGATIVE

## 2020-11-10 SURGERY — EXCISION, CYST, OVARY, LAPAROSCOPIC
Anesthesia: General | Site: Abdomen | Laterality: Bilateral

## 2020-11-10 MED ORDER — MIDAZOLAM HCL 2 MG/2ML IJ SOLN
INTRAMUSCULAR | Status: DC | PRN
  Administered 2020-11-10: 1 mg via INTRAVENOUS

## 2020-11-10 MED ORDER — LIDOCAINE HCL (CARDIAC) PF 100 MG/5ML IV SOSY
PREFILLED_SYRINGE | INTRAVENOUS | Status: DC | PRN
  Administered 2020-11-10: 100 mg via INTRAVENOUS

## 2020-11-10 MED ORDER — OXYCODONE HCL 5 MG PO TABS: 5.0000 mg | ORAL_TABLET | ORAL | 0 refills | Status: DC | PRN

## 2020-11-10 MED ORDER — SILVER NITRATE-POT NITRATE 75-25 % EX MISC
CUTANEOUS | Status: DC | PRN
  Administered 2020-11-10: 4

## 2020-11-10 MED ORDER — ROCURONIUM BROMIDE 100 MG/10ML IV SOLN
INTRAVENOUS | Status: DC | PRN
  Administered 2020-11-10: 55 mg via INTRAVENOUS

## 2020-11-10 MED ORDER — IBUPROFEN 600 MG PO TABS: 600.0000 mg | ORAL_TABLET | Freq: Four times a day (QID) | ORAL | 2 refills | Status: DC | PRN

## 2020-11-10 MED ORDER — FENTANYL CITRATE (PF) 250 MCG/5ML IJ SOLN
INTRAMUSCULAR | Status: AC
  Filled 2020-11-10: qty 5

## 2020-11-10 MED ORDER — AMISULPRIDE (ANTIEMETIC) 5 MG/2ML IV SOLN
INTRAVENOUS | Status: AC
  Filled 2020-11-10: qty 4

## 2020-11-10 MED ORDER — BUPIVACAINE HCL (PF) 0.5 % IJ SOLN
INTRAMUSCULAR | Status: DC | PRN
  Administered 2020-11-10: 18 mL
  Administered 2020-11-10: 7 mL

## 2020-11-10 MED ORDER — KETOROLAC TROMETHAMINE 30 MG/ML IJ SOLN: 30.0000 mg | Freq: Once | INTRAMUSCULAR | Status: DC | PRN

## 2020-11-10 MED ORDER — OXYCODONE HCL 5 MG/5ML PO SOLN: 5.0000 mg | Freq: Once | ORAL | Status: DC | PRN

## 2020-11-10 MED ORDER — GABAPENTIN 300 MG PO CAPS
300.0000 mg | ORAL_CAPSULE | ORAL | Status: AC
  Administered 2020-11-10: 300 mg via ORAL

## 2020-11-10 MED ORDER — SUGAMMADEX SODIUM 200 MG/2ML IV SOLN
INTRAVENOUS | Status: DC | PRN
  Administered 2020-11-10: 100 mg via INTRAVENOUS

## 2020-11-10 MED ORDER — KETOROLAC TROMETHAMINE 30 MG/ML IJ SOLN
INTRAMUSCULAR | Status: DC | PRN
  Administered 2020-11-10: 30 mg via INTRAVENOUS

## 2020-11-10 MED ORDER — MIDAZOLAM HCL 2 MG/2ML IJ SOLN
INTRAMUSCULAR | Status: AC
  Filled 2020-11-10: qty 2

## 2020-11-10 MED ORDER — PROMETHAZINE HCL 25 MG PO TABS: 25.0000 mg | ORAL_TABLET | Freq: Four times a day (QID) | ORAL | 2 refills | Status: DC | PRN

## 2020-11-10 MED ORDER — CEFAZOLIN SODIUM-DEXTROSE 2-4 GM/100ML-% IV SOLN
2.0000 g | INTRAVENOUS | Status: AC
  Administered 2020-11-10: 2 g via INTRAVENOUS

## 2020-11-10 MED ORDER — OXYCODONE HCL 5 MG PO TABS: 5.0000 mg | ORAL_TABLET | Freq: Once | ORAL | Status: DC | PRN

## 2020-11-10 MED ORDER — ONDANSETRON HCL 4 MG/2ML IJ SOLN: 4.0000 mg | Freq: Once | INTRAMUSCULAR | Status: DC | PRN

## 2020-11-10 MED ORDER — HYDROMORPHONE HCL 1 MG/ML IJ SOLN
0.2500 mg | INTRAMUSCULAR | Status: DC | PRN
  Administered 2020-11-10 (×4): 0.25 mg via INTRAVENOUS

## 2020-11-10 MED ORDER — ONDANSETRON HCL 4 MG/2ML IJ SOLN
INTRAMUSCULAR | Status: DC | PRN
  Administered 2020-11-10: 4 mg via INTRAVENOUS

## 2020-11-10 MED ORDER — LACTATED RINGERS IV SOLN: INTRAVENOUS | Status: DC

## 2020-11-10 MED ORDER — POVIDONE-IODINE 10 % EX SWAB: 2.0000 "application " | Freq: Once | CUTANEOUS | Status: DC

## 2020-11-10 MED ORDER — GABAPENTIN 300 MG PO CAPS
ORAL_CAPSULE | ORAL | Status: AC
  Filled 2020-11-10: qty 1

## 2020-11-10 MED ORDER — SUGAMMADEX SODIUM 200 MG/2ML IV SOLN: INTRAVENOUS | Status: DC | PRN

## 2020-11-10 MED ORDER — HYDROMORPHONE HCL 1 MG/ML IJ SOLN
INTRAMUSCULAR | Status: AC
  Filled 2020-11-10: qty 1

## 2020-11-10 MED ORDER — DEXAMETHASONE SODIUM PHOSPHATE 4 MG/ML IJ SOLN
INTRAMUSCULAR | Status: DC | PRN
  Administered 2020-11-10: 8 mg via INTRAVENOUS

## 2020-11-10 MED ORDER — ACETAMINOPHEN 500 MG PO TABS
1000.0000 mg | ORAL_TABLET | ORAL | Status: AC
  Administered 2020-11-10: 1000 mg via ORAL

## 2020-11-10 MED ORDER — PROPOFOL 10 MG/ML IV BOLUS
INTRAVENOUS | Status: DC | PRN
  Administered 2020-11-10: 110 mg via INTRAVENOUS
  Administered 2020-11-10 (×2): 40 mg via INTRAVENOUS

## 2020-11-10 MED ORDER — ONDANSETRON 4 MG PO TBDP
4.0000 mg | ORAL_TABLET | Freq: Once | ORAL | Status: AC
  Administered 2020-11-10: 4 mg via ORAL

## 2020-11-10 MED ORDER — AMISULPRIDE (ANTIEMETIC) 5 MG/2ML IV SOLN
10.0000 mg | Freq: Once | INTRAVENOUS | Status: AC
  Administered 2020-11-10: 10 mg via INTRAVENOUS

## 2020-11-10 MED ORDER — CEFAZOLIN SODIUM-DEXTROSE 2-4 GM/100ML-% IV SOLN
INTRAVENOUS | Status: AC
  Filled 2020-11-10: qty 100

## 2020-11-10 MED ORDER — ONDANSETRON 4 MG PO TBDP
ORAL_TABLET | ORAL | Status: AC
  Filled 2020-11-10: qty 1

## 2020-11-10 MED ORDER — ACETAMINOPHEN 500 MG PO TABS
ORAL_TABLET | ORAL | Status: AC
  Filled 2020-11-10: qty 2

## 2020-11-10 MED ORDER — FENTANYL CITRATE (PF) 100 MCG/2ML IJ SOLN
INTRAMUSCULAR | Status: DC | PRN
  Administered 2020-11-10 (×2): 25 ug via INTRAVENOUS
  Administered 2020-11-10 (×2): 50 ug via INTRAVENOUS

## 2020-11-10 MED ORDER — SODIUM CHLORIDE 0.9 % IR SOLN
Status: DC | PRN
  Administered 2020-11-10: 6000 mL

## 2020-11-10 MED ORDER — DOCUSATE SODIUM 100 MG PO CAPS: 100.0000 mg | ORAL_CAPSULE | Freq: Two times a day (BID) | ORAL | 2 refills | Status: DC | PRN

## 2020-11-10 MED ORDER — MIDAZOLAM HCL 2 MG/2ML IJ SOLN: INTRAMUSCULAR | Status: DC | PRN

## 2020-11-10 SURGICAL SUPPLY — 33 items
ADH SKN CLS APL DERMABOND .7 (GAUZE/BANDAGES/DRESSINGS) ×1
BAG SPEC RTRVL LRG 6X4 10 (ENDOMECHANICALS) ×2
CABLE HIGH FREQUENCY MONO STRZ (ELECTRODE) ×1 IMPLANT
CATH ROBINSON RED A/P 16FR (CATHETERS) ×2 IMPLANT
COVER MAYO STAND STRL (DRAPES) ×2 IMPLANT
DERMABOND ADVANCED (GAUZE/BANDAGES/DRESSINGS) ×1
DERMABOND ADVANCED .7 DNX12 (GAUZE/BANDAGES/DRESSINGS) ×1 IMPLANT
DRSG OPSITE POSTOP 3X4 (GAUZE/BANDAGES/DRESSINGS) IMPLANT
DURAPREP 26ML APPLICATOR (WOUND CARE) ×2 IMPLANT
GAUZE 4X4 16PLY RFD (DISPOSABLE) ×2 IMPLANT
GLOVE SURG ENC MOIS LTX SZ7.5 (GLOVE) ×1 IMPLANT
GLOVE SURG LTX SZ7 (GLOVE) ×2 IMPLANT
GLOVE SURG UNDER POLY LF SZ7 (GLOVE) ×7 IMPLANT
GLOVE SURG UNDER POLY LF SZ7.5 (GLOVE) ×1 IMPLANT
GOWN STRL REUS W/TWL XL LVL3 (GOWN DISPOSABLE) ×4 IMPLANT
IV NS IRRIG 3000ML ARTHROMATIC (IV SOLUTION) ×2 IMPLANT
KIT TURNOVER CYSTO (KITS) ×2 IMPLANT
MANIFOLD NEPTUNE II (INSTRUMENTS) ×1 IMPLANT
NS IRRIG 1000ML POUR BTL (IV SOLUTION) ×2 IMPLANT
PACK LAPAROSCOPY BASIN (CUSTOM PROCEDURE TRAY) ×2 IMPLANT
PACK TRENDGUARD 450 HYBRID PRO (MISCELLANEOUS) IMPLANT
POUCH SPECIMEN RETRIEVAL 10MM (ENDOMECHANICALS) ×2 IMPLANT
PROTECTOR NERVE ULNAR (MISCELLANEOUS) ×2 IMPLANT
SET SUCTION IRRIG HYDROSURG (IRRIGATION / IRRIGATOR) ×1 IMPLANT
SET TUBE SMOKE EVAC HIGH FLOW (TUBING) ×2 IMPLANT
SHEARS HARMONIC ACE PLUS 36CM (ENDOMECHANICALS) ×1 IMPLANT
SUT MNCRL AB 4-0 PS2 18 (SUTURE) ×3 IMPLANT
SUT VICRYL 0 UR6 27IN ABS (SUTURE) ×2 IMPLANT
TOWEL OR 17X26 10 PK STRL BLUE (TOWEL DISPOSABLE) ×2 IMPLANT
TRAY FOLEY W/BAG SLVR 14FR LF (SET/KITS/TRAYS/PACK) IMPLANT
TRENDGUARD 450 HYBRID PRO PACK (MISCELLANEOUS) ×2
TROCAR BLADELESS OPT 5 100 (ENDOMECHANICALS) ×4 IMPLANT
TROCAR XCEL NON-BLD 11X100MML (ENDOMECHANICALS) ×3 IMPLANT

## 2020-11-10 NOTE — H&P (Signed)
Preoperative History and Physical  Cassandra Lee is a 40 y.o. [id number] here for surgical management of symptomatic bilateral ovarian dermoid cysts.  She speaks Dion Body, interpreter used during encounter.  Of note, the interpreter used was her sister-in-law as there were no medical interpreters available today, and this is unfortunately usually the case with this dialect/language. Her sister-in-law is the one routinely used for interpretation during her medical encounters. No significant preoperative concerns.  Proposed surgery: Laparoscopic bilateral ovarian cystectomy, possible oophorectomy, possible laparotomy  Past Medical History:  Diagnosis Date  . Anemia   . Gastroesophageal reflux disease 01/20/2009   Qualifier: Diagnosis of  By: Charlett Blake MD, Apolonio Schneiders     Past Surgical History:  Procedure Laterality Date  . TUBAL LIGATION N/A 04/05/2015   Procedure: POST PARTUM TUBAL LIGATION;  Surgeon: Truett Mainland, DO;  Location: [facility];  Service: Gynecology;  Laterality: N/A;   OB History  Gravida Para Term Preterm AB Living  6 5 5  0 1 5  SAB IAB Ectopic Multiple Live Births  1 0 0 0 5    # Outcome Date GA Lbr Len/2nd Weight Sex Delivery Anes PTL Lv  6 Term 04/05/15 [redacted]w[redacted]d 04:09 7 lb 14.1 oz (3.575 kg) M Vag-Spont None  LIV  5 Term 04/20/13 [redacted]w[redacted]d 02:27 / 00:02 7 lb 7 oz (3.374 kg) F Vag-Spont None  LIV  4 Term 04/20/11 [redacted]w[redacted]d 01:25 / 00:01 7 lb 9.9 oz (3.456 kg) F Vag-Spont None N LIV  3 SAB 2011 [redacted]w[redacted]d         2 Term 06/24/08     Vag-Spont  N LIV  1 Term 05/06/06    Gus Puma LIV  Patient denies any other pertinent gynecologic issues.   No current facility-administered medications on file prior to encounter.   Current Outpatient Medications on File Prior to Encounter  Medication Sig Dispense Refill  . [DISCONTINUED] ipratropium (ATROVENT) 0.06 % nasal spray Place 2 sprays into both nostrils 4 (four) times daily. 15 mL 0   No Known Allergies  Social History:   reports that  she has never smoked. She has never used smokeless tobacco. She reports that she does not drink alcohol and does not use drugs.  Family History  Problem Relation Age of Onset  . Birth defects Neg Hx   . Early death Neg Hx   . Chromosomal disorder Neg Hx     Review of Systems: Pertinent items noted in HPI and remainder of comprehensive ROS otherwise negative.  PHYSICAL EXAM: Blood pressure 131/87, pulse 84, temperature 98.2 F (36.8 C), temperature source Oral, resp. rate 14, height 5\' 1"  (1.549 m), weight 112 lb 3.2 oz (50.9 kg), last menstrual period 10/23/2020, SpO2 100 %. CONSTITUTIONAL: Well-developed, well-nourished female in no acute distress.  HENT:  Normocephalic, atraumatic, External right and left ear normal. Oropharynx is clear and moist EYES: Conjunctivae and EOM are normal. Pupils are equal, round, and reactive to light. No scleral icterus.  NECK: Normal range of motion, supple, no masses SKIN: Skin is warm and dry. No rash noted. Not diaphoretic. No erythema. No pallor. NEUROLOGIC: Alert and oriented to person, place, and time. Normal reflexes, muscle tone coordination. No cranial nerve deficit noted. PSYCHIATRIC: Normal mood and affect. Normal behavior. Normal judgment and thought content. CARDIOVASCULAR: Normal heart rate noted, regular rhythm RESPIRATORY: Effort and breath sounds normal, no problems with respiration noted ABDOMEN: Soft, nontender, nondistended. PELVIC: Deferred MUSCULOSKELETAL: Normal range of motion. No edema and no  tenderness. 2+ distal pulses.  Labs: Results for orders placed or performed during the hospital encounter of 11/10/20 (from the past 168 hour(s))  Pregnancy, urine POC   Collection Time: 11/10/20 10:19 AM  Result Value Ref Range   Preg Test, Ur NEGATIVE NEGATIVE  Results for orders placed or performed during the hospital encounter of 11/08/20 (from the past 168 hour(s))  SARS CORONAVIRUS 2 (TAT 6-24 HRS) Nasopharyngeal Nasopharyngeal  Swab   Collection Time: 11/08/20 11:07 AM   Specimen: Nasopharyngeal Swab  Result Value Ref Range   SARS Coronavirus 2 NEGATIVE NEGATIVE   07/29/2020 CA 125 18.6  Imaging Studies: MR PELVIS W WO CONTRAST  Result Date: 09/13/2020 CLINICAL DATA:  5.2 cm complicated left adnexal cyst on recent ultrasound exam. Patient history of left ovarian dermoid diagnosed by ultrasound on 04/30/2009. Now with right lower quadrant pain. EXAM: MRI PELVIS WITHOUT AND WITH CONTRAST TECHNIQUE: Multiplanar multisequence MR imaging of the pelvis was performed both before and after administration of intravenous contrast. CONTRAST:  98mL GADAVIST GADOBUTROL 1 MMOL/ML IV SOLN COMPARISON:  Ultrasound exam 07/22/2020 FINDINGS: Urinary Tract: The urinary bladder appears normal for the degree of distention. Bowel:  No bowel dilatation within the visualized pelvis. Vascular/Lymphatic: No pathologically enlarged lymph nodes. No significant vascular abnormality seen. Reproductive: Uterus has a somewhat globular configuration and is position to the right of midline, measuring 6.7 x 4.5 x 4.4 cm junctional zone preserved. 6.0 x 5.0 x 6.4 cm well-defined lesion is identified in the right adnexal space. Postcontrast T1 axial image 10 of series 10 shows ovarian parenchyma with follicles immediately posterior to this lesion with claw sign consistent with right ovarian origin. Comparing T1 imaging without fat suppression (image 13/series 6) to T1 imaging with fat suppression (image 13/series 7) this lesion shows diffuse loss of signal intensity on fat suppressed imaging consistent with mainly fat composition. There is some heterogeneous signal intensity in the cranial aspect of this mass which is positioned immediately anterior to a small focus of susceptibility artifact, likely tubal ligation clip. There is some heterogeneous enhancement of the tissue in the cranial aspect of this lesion. Given the diffuse loss of signal intensity on fat  suppressed T1 imaging, lesion is most compatible with ovarian dermoid. 4.8 x 3.6 x 4.4 cm well-defined lesion in the left adnexal space has smooth margins without wall irregularity. This lesion also shows diffuse loss of signal intensity on fat suppressed T1 imaging, compatible with mainly fat composition. There is a irregular nodular component of enhancing soft tissue posteromedially in the lesion and also a peripheral focus of susceptibility artifact along the medial wall of the lesion suggesting calcification. Apparent tubal ligation clip noted just cranial to this abnormality. As on the right, imaging features of this lesion are most compatible with ovarian dermoid. Other:  No intraperitoneal free fluid. Musculoskeletal: No focal suspicious marrow enhancement within the visualized bony anatomy. IMPRESSION: 1. 6.4 x 5.0 x 6.4 cm well-defined lesion in the right adnexal space shows mainly fat composition and is most compatible with right ovarian dermoid. 2. 4.8 x 3.6 x 4.4 cm well-defined lesion in the left adnexal space shows mainly fat composition, also most compatible with a benign ovarian dermoid. This lesion was identified on ultrasound dating as far back as 04/30/2009 measuring 2.3 x 3.0 x 2.0 cm at that time. 3. No acute findings in the pelvis. Electronically Signed   By: Misty Stanley M.D.   On: 09/13/2020 08:38     Assessment: Patient Active Problem List  Diagnosis Date Noted  . Abdominal pain 02/17/2017  . 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   The risks of surgery were discussed in detail with the patient including but not limited to: bleeding which may require transfusion or reoperation; infection which may require antibiotics; injury to surrounding organs which may involve bowel, bladder, ureters; peritonitis from cyst contents that can cause  subsequent issues; need for additional procedures including laparotomy or subsequent procedures secondary to abnormal pathology; need for hormonal replacement therapy in the rare event of bilateral removal of ovaries; thromboembolic phenomenon, surgical site problems and other postoperative/anesthesia complications. Likelihood of success in alleviating the patient's condition was discussed. Routine postoperative instructions will be reviewed with the patient and her family in detail after surgery.  The patient concurred with the proposed plan, giving informed written consent for the surgery.  Patient has been NPO since last night and she will remain NPO for procedure.  Anesthesia and OR aware.  Preoperative prophylactic antibiotics and SCDs ordered on call to the OR.  To OR when ready.    Verita Schneiders, MD, [place] [facility] [employer], [place]

## 2020-11-10 NOTE — Transfer of Care (Signed)
Immediate Anesthesia Transfer of Care Note  Patient: Cassandra Lee  Procedure(s) Performed: LAPAROSCOPIC LYSIS OF ADHESIONS; LEFT OVARIAN CYSTECTOMY; RIGHT SALPINGO-OOPHORECTOMY (Bilateral Abdomen)  Patient Location: PACU  Anesthesia Type:General  Level of Consciousness: awake, alert , oriented and patient cooperative  Airway & Oxygen Therapy: Patient Spontanous Breathing and Patient connected to nasal cannula oxygen  Post-op Assessment: Report given to RN and Post -op Vital signs reviewed and stable  Post vital signs: Reviewed and stable  Last Vitals:  Vitals Value Taken Time  BP 126/90 11/10/20 1434  Temp    Pulse 76 11/10/20 1437  Resp 17 11/10/20 1437  SpO2 100 % 11/10/20 1437  Vitals shown include unvalidated device data.  Last Pain:  Vitals:   11/10/20 1047  TempSrc: Oral  PainSc: 5       Patients Stated Pain Goal: 4 (74/[id number])  Complications: No complications documented.

## 2020-11-10 NOTE — Anesthesia Procedure Notes (Signed)
Procedure Name: Intubation Date/Time: 11/10/2020 12:33 PM Performed by: Georgeanne Nim, CRNA Pre-anesthesia Checklist: Patient identified, Emergency Drugs available, Suction available, Patient being monitored and Timeout performed Patient Re-evaluated:Patient Re-evaluated prior to induction Oxygen Delivery Method: Circle system utilized Preoxygenation: Pre-oxygenation with 100% oxygen Induction Type: IV induction Ventilation: Mask ventilation without difficulty Tube size: 7.0 mm Number of attempts: 1 Airway Equipment and Method: Stylet Placement Confirmation: ETT inserted through vocal cords under direct vision,  positive ETCO2,  CO2 detector and breath sounds checked- equal and bilateral Secured at: 20 cm Tube secured with: Tape Dental Injury: Teeth and Oropharynx as per pre-operative assessment

## 2020-11-10 NOTE — Discharge Instructions (Signed)
Laparoscopic Surgery - Care After Laparoscopy is a surgical procedure. It is used to diagnose and treat diseases inside the belly(abdomen). It is usually a brief, common, and relatively simple procedure. The laparoscopeis a thin, lighted, pencil-sized instrument. It is like a telescope. It is inserted into your abdomen through a small cut (incision). Your caregiver can look at the organs inside your body through this instrument.  She can see if there is anything abnormal. Laparoscopy can be done either in a hospital or outpatient clinic. You may be given a mild sedative to help you relax before the procedure. Once in the operating room, you will be given a drug to make you sleep (general anesthesia). Laparoscopy usually lasts about 1 hour. After the procedure, you will be monitored in a recovery area until you are stable and doing well. Once you are home, it may take 3 to 7 days to fully recover.   Laparoscopy has relatively few risks. Your caregiver will discuss the risks with you before the procedure. Some problems that can occur include: RISKS AND COMPLICATIONS   Allergies to medicines.  Difficulty breathing.  Bleeding.  Infection.  Damage to other surrounding structures HOME CARE INSTRUCTIONS   Infection.  Bleeding.  Damage to other organs.  Anesthetic side effects.   Need for additional procedures such as open procedures/laparotomy PROCEDURE Once you receive anesthesia, your surgeon inflates the abdomen with a harmless gas (carbon dioxide). This makes the organs easier to see. The laparoscope is inserted into the abdomen through a small incision. This allows your surgeon to see into the abdomen. Other small instruments are also inserted into the abdomen through other small openings. Many surgeons attach a video camera to the laparoscope to enlarge the view. During a laparoscopy, the surgeon may be looking for inflammation, infection, or cancer.  The surgeon may also need to take  out certain organs or take tissue samples (biopsies). The specimens are sent to a specialist in looking at cells and tissue samples (pathologist). The pathologist examines them under a microscope to help to diagnose or confirm a disease. AFTER THE PROCEDURE   The incisions are closed with stitches (sutures) and Dermabond. Because these incisions are small (usually less than 1/2 inch), there is usually minimal discomfort after the procedure. There may also be discomfort from the instrument placement incisions in the abdomen. You will be given pain medicine to ease any discomfort.  You will rest in a recovery room for 1-2 hours until you are stable and doing well.  You may have some mild discomfort in the throat. This is from the tube placed in your throat while you were sleeping.  You may experience discomfort in the shoulder area from some trapped air between the liver and diaphragm. This sensation is normal and will slowly go away on its own.  The recovery time is shortened as long as there are no complications.  You will rest in a recovery room until stable and doing well. As long as there are no complications, you may be allowed to go home. Someone will need to drive you home and be with you for at least 24 hours once home. FINDING OUT THE RESULTS You will be called with the results of the pathology and will discuss these results with  your caregiver during your postoperative appointment. Do not assume everything is normal if you have not heard from your caregiver or the medical facility. It is important for you to follow up on all of your results. HOME  CARE INSTRUCTIONS   Take all medicines as directed.  Only take over-the-counter or prescription medicines for pain, discomfort, or fever as directed by your caregiver.  Resume daily activities as directed.  Showers are preferred over baths.  You may resume sexual activities in 1 week or as directed.  Do not drive while taking  narcotics. SEEK MEDICAL CARE IF:  There is increasing abdominal pain.  You feel lightheaded or faint.  You have the chills.  You have an oral temperature above 102 F (38.9 C).  There is pus-like (purulent) drainage from any of the wounds.  You are unable to pass gas or have a bowel movement.  You feel sick to your stomach (nauseous) or throw up (vomit). MAKE SURE YOU:   Understand these instructions.  Will watch your condition.  Will get help right away if you are not doing well or get worse.  ExitCare Patient Information 2013 [place].     Post Anesthesia Home Care Instructions  Activity: Get plenty of rest for the remainder of the day. A responsible individual must stay with you for 24 hours following the procedure.  For the next 24 hours, DO NOT: -Drive a car -Paediatric nurse -Drink alcoholic beverages -Take any medication unless instructed by your physician -Make any legal decisions or sign important papers.  Meals: Start with liquid foods such as gelatin or soup. Progress to regular foods as tolerated. Avoid greasy, spicy, heavy foods. If nausea and/or vomiting occur, drink only clear liquids until the nausea and/or vomiting subsides. Call your physician if vomiting continues.  Special Instructions/Symptoms: Your throat may feel dry or sore from the anesthesia or the breathing tube placed in your throat during surgery. If this causes discomfort, gargle with warm salt water. The discomfort should disappear within 24 hours.  If you had a scopolamine patch placed behind your ear for the management of post- operative nausea and/or vomiting:  1. The medication in the patch is effective for 72 hours, after which it should be removed.  Wrap patch in a tissue and discard in the trash. Wash hands thoroughly with soap and water. 2. You may remove the patch earlier than 72 hours if you experience unpleasant side effects which may include dry mouth, dizziness or  visual disturbances. 3. Avoid touching the patch. Wash your hands with soap and water after contact with the patch.  Do not take any tylenol until after 5:00 pm today.

## 2020-11-10 NOTE — Anesthesia Preprocedure Evaluation (Signed)
Anesthesia Evaluation  Patient identified by MRN, date of birth, ID band Patient awake    Reviewed: Allergy & Precautions, NPO status , Patient's Chart, lab work & pertinent test results  Airway Mallampati: II  TM Distance: >3 FB Neck ROM: Full    Dental no notable dental hx.    Pulmonary neg pulmonary ROS,    Pulmonary exam normal breath sounds clear to auscultation       Cardiovascular negative cardio ROS Normal cardiovascular exam Rhythm:Regular Rate:Normal     Neuro/Psych negative neurological ROS  negative psych ROS   GI/Hepatic negative GI ROS, Neg liver ROS,   Endo/Other  negative endocrine ROS  Renal/GU negative Renal ROS  negative genitourinary   Musculoskeletal negative musculoskeletal ROS (+)   Abdominal   Peds negative pediatric ROS (+)  Hematology negative hematology ROS (+)   Anesthesia Other Findings   Reproductive/Obstetrics negative OB ROS                             Anesthesia Physical Anesthesia Plan  ASA: I  Anesthesia Plan: General   Post-op Pain Management:    Induction: Intravenous  PONV Risk Score and Plan: 3 and Ondansetron, Dexamethasone, Midazolam and Treatment may vary due to age or medical condition  Airway Management Planned: Oral ETT  Additional Equipment:   Intra-op Plan:   Post-operative Plan: Extubation in OR  Informed Consent: I have reviewed the patients History and Physical, chart, labs and discussed the procedure including the risks, benefits and alternatives for the proposed anesthesia with the patient or authorized representative who has indicated his/her understanding and acceptance.     Dental advisory given  Plan Discussed with: CRNA and Surgeon  Anesthesia Plan Comments:         Anesthesia Quick Evaluation  

## 2020-11-10 NOTE — Op Note (Addendum)
Cassandra Lee PROCEDURE DATE: 11/10/2020  PREOPERATIVE DIAGNOSES: Bilateral ovarian dermoids, lower abdominal pain POSTOPERATIVE DIAGNOSES: The same PROCEDURE: Laparoscopic right salpingoophorectomy, left ovarian cystectomy and lysis of adhesions SURGEON:  Dr. Verita Schneiders ASSISTANT:  Dr. Arvilla Meres. An experienced assistant was required given the standard of surgical care given the complexity of the case.  This assistant was needed for exposure, dissection, suctioning, retraction, instrument exchange, and for overall help during the procedure. ANESTHESIOLOGY TEAM: Anesthesiologist: Myrtie Soman, MD CRNA: Lieutenant Diego, CRNA; Georgeanne Nim, CRNA  INDICATIONS: 40 y.o. [phone] with aforementioned preoperative diagnoses here today for surgical management.   Risks of surgery were discussed with the patient including but not limited to: bleeding which may require transfusion or reoperation; infection which may require antibiotics; injury to surrounding organs which may involve bowel, bladder, ureters; peritonitis from cyst contents that can cause subsequent issues; need for additional procedures including laparotomy or subsequent procedures secondary to abnormal pathology; need for hormonal replacement therapy in the rare event of bilateral removal of ovaries; thromboembolic phenomenon, surgical site problems and other postoperative/anesthesia complications. Written informed consent was obtained.    FINDINGS:  Small uterus, left adnexa with 5 cm dermoid cyst noted at periphery of the ovary.  Normal ovarian tissue noted near the hilum. Dermoid was successfully removed, and normal ovarian tissue saved.  There were adhesions of bowel to the left adnexa, these were lysed prior to the cystectomy.  During the cystectomy, there was some rupture of small amount of fatty fluid into peritoneum, aggressive irrigation was done at the end of case with over five liters of fluid. On the right side, there was a large  about 8 cm cyst encompassing the entire ovary; no normal ovarian tissue was seen.  The entire adnexa was removed on this side.  Bilateral tubes were noted to be previously sterilized using Filshie clips. No evidence of endometriosis, other adhesions or any other abdominal/pelvic abnormality.  Normal upper abdomen.  ANESTHESIA:   General INTRAVENOUS FLUIDS: 1100 ml ESTIMATED BLOOD LOSS: 25 ml SPECIMENS:  Right ovary and fallopian tube, left ovarian cyst COMPLICATIONS: None immediate  PROCEDURE IN DETAIL:  The patient received intravenous antibiotics and had sequential compression devices applied to her lower extremities while in the preoperative area.  She was then taken to the operating room where general anesthesia was administered and was found to be adequate.  She was placed in the dorsal lithotomy position, and was prepped and draped in a sterile manner.  A Foley catheter was inserted into her bladder and attached to constant drainage and a uterine manipulator was then advanced into the uterus . After an adequate timeout was performed, attention was then turned to the patient's abdomen where a 11-mm skin incision was made in the umbilical fold.  The Optiview 11-mm trocar and sleeve were then advanced without difficulty with the laparoscope under direct visualization into the abdomen.  The abdomen was then insufflated with carbon dioxide gas.  Adequate pneumoperitoneum was obtained.  A survey of the patient's pelvis and abdomen revealed the findings above. One 11-mm port was placed on the left lower quadrant and a 5-mm right lower quadrant ports was also placed under direct visualization.  Adhesions from the bowel to the left adnexa were recognized and lysed using the Harmonic instrument.  Attention was turned to the left ovary and an incision was made over the dermoid cyst.  Using traction and countertractions methods, normal ovarian tissue was peeled off the dermoid.  There was a  small puncture made  into the cyst and some spillage of fatty liquid during cystectomy. This was immediately irrigated with copious fluid.   The cyst was removed, placed in an Endocatch bag and removed from the abdomen intact. Minimal bleeding was noted at the base of the defect created by cyst removal, this was controlled with electrocautery.  On the right side, it was decided that the best course of action was to proceed with salpingoophorectomy given lack of normal ovarian tissue visualized. The ureter was visualized and was peristalsing away from operative site.  The right uteroovarian ligament was then clamped and transected with the Harmonic device.  The right infundibulopelvic ligament was also clamped and transected allowing for salpingooophorectomy.  Excellent hemostasis was noted. The specimen was then removed from the abdomen through the 11-mm port using an Endocatch bag, under direct visualization.  The operative site was surveyed, and it was found to be hemostatic.  No intraoperative injury to other surrounding organs was noted. Aggressive irrigation of the peritoneal cavity was done with over five liters of fluid.   The abdomen was desufflated and all instruments were then removed from the patient's abdomen. The fascial incisions of the umbilicus and left lower quadrant port were closed with a 0 Vicryl figure of eight stitch.  All skin incisions were closed with 4-0 Monocryl subcuticular stitches and Dermabond.  The patient tolerated the procedure well.  Sponge, lap, and needle counts were correct times three.  The patient was then taken to the recovery room awake, extubated and in stable condition.  The patient will be discharged to home as per PACU criteria.  Routine postoperative instructions given.  She was prescribed Oxycodone, Ibuprofen and Colace.  She will follow up in the office in 2-3 weeks for postoperative evaluation.   Verita Schneiders, MD, [place] for  [employer], [place]

## 2020-11-12 ENCOUNTER — Encounter (HOSPITAL_BASED_OUTPATIENT_CLINIC_OR_DEPARTMENT_OTHER): Payer: Self-pay | Admitting: Obstetrics & Gynecology

## 2020-11-12 LAB — SURGICAL PATHOLOGY

## 2020-11-12 NOTE — Anesthesia Postprocedure Evaluation (Signed)
Anesthesia Post Note  Patient: Cassandra Lee  Procedure(s) Performed: LAPAROSCOPIC LYSIS OF ADHESIONS; LEFT OVARIAN CYSTECTOMY; RIGHT SALPINGO-OOPHORECTOMY (Bilateral Abdomen)     Patient location during evaluation: PACU Anesthesia Type: General Level of consciousness: awake and alert Pain management: pain level controlled Vital Signs Assessment: post-procedure vital signs reviewed and stable Respiratory status: spontaneous breathing, nonlabored ventilation, respiratory function stable and patient connected to nasal cannula oxygen Cardiovascular status: blood pressure returned to baseline and stable Postop Assessment: no apparent nausea or vomiting Anesthetic complications: no   No complications documented.  Last Vitals:  Vitals:   11/10/20 1600 11/10/20 1650  BP: (!) 112/53 118/83  Pulse: 75 67  Resp:  14  Temp:  36.7 C  SpO2: 100% 100%    Last Pain:  Vitals:   11/10/20 1650  TempSrc: Oral  PainSc: 4                  Patty Leitzke S

## 2020-11-15 ENCOUNTER — Telehealth: Payer: Self-pay | Admitting: General Practice

## 2020-11-15 NOTE — Telephone Encounter (Signed)
-----   Message from Osborne Oman, MD sent at 11/12/2020  9:51 AM EDT ----- Benign surgical pathology showing dermoids. Please call to inform patient of results. Speaks Montagnard Mike Gip.

## 2020-11-15 NOTE — Telephone Encounter (Signed)
Called patient with [facility] Lee for interpreter and informed her of results. Patient verbalized understanding & had no questions.

## 2020-11-24 ENCOUNTER — Ambulatory Visit (INDEPENDENT_AMBULATORY_CARE_PROVIDER_SITE_OTHER): Payer: Medicaid Other | Admitting: Obstetrics & Gynecology

## 2020-11-24 ENCOUNTER — Encounter: Payer: Self-pay | Admitting: Obstetrics & Gynecology

## 2020-11-24 ENCOUNTER — Other Ambulatory Visit: Payer: Self-pay

## 2020-11-24 VITALS — BP 110/75 | HR 88 | Wt 110.6 lb

## 2020-11-24 DIAGNOSIS — D270 Benign neoplasm of right ovary: Secondary | ICD-10-CM

## 2020-11-24 DIAGNOSIS — D271 Benign neoplasm of left ovary: Secondary | ICD-10-CM

## 2020-11-24 MED ORDER — DOCUSATE SODIUM 100 MG PO CAPS: 100.0000 mg | ORAL_CAPSULE | Freq: Two times a day (BID) | ORAL | 2 refills | Status: DC | PRN

## 2020-11-24 NOTE — Progress Notes (Signed)
Subjective:     Amahia H Salais is a 40 y.o. female who presents to the clinic 2 weeks status post bilateral ovarian cystectomy for adnexal mass. Eating a regular diet without difficulty. Bowel movements are abnormal with straining. Pain is controlled without any medications.  The following portions of the patient's history were reviewed and updated as appropriate: allergies, current medications, past family history, past medical history, past social history, past surgical history and problem list.  Review of Systems Pertinent items are noted in HPI.    Objective:    BP 110/75   Pulse 88   Wt 110 lb 9.6 oz (50.2 kg)   LMP 10/29/2020   BMI 20.90 kg/m  General:  alert, cooperative and no distress  Abdomen: soft, non-tender  Incision:   healing well, no drainage, no erythema, no hernia, no seroma, no swelling, no dehiscence, incision well approximated     Assessment:    Doing well postoperatively. Operative findings again reviewed. Pathology report discussed.    Plan:    1. Continue any current medications. 2. Wound care discussed. 3. Activity restrictions: no lifting more than 15 pounds 4. Anticipated return to work: 2-3 weeks. 5. Follow up: prn  Woodroe Mode, MD  . Patient ID: [name], female   DOB: 02-17-1981, 40 y.o.   MRN: [id number]

## 2020-11-24 NOTE — Patient Instructions (Signed)
Diagnostic Laparoscopy Diagnostic laparoscopy is a procedure to diagnose problems in the abdomen. It might be done for a variety of reasons, such as to look for scar tissue, a reason for abdominal pain, an abdominal mass or tumor, or fluid in the abdomen (ascites). This procedure may also be done to remove a tissue sample from the liver to look at under a microscope (biopsy). During the procedure, a thin, flexible tube that has a light and a camera on the end (laparoscope) is inserted through a small incision in the abdomen. The image from the camera is shown on a monitor to help the surgeon see inside the body. Tell a health care provider about:  Any allergies you have.  All medicines you are taking, including vitamins, herbs, eye drops, creams, and over-the-counter medicines.  Any problems you or family members have had with anesthetic medicines.  Any blood disorders you have.  Any surgeries you have had.  Any medical conditions you have.  Whether you are pregnant or may be pregnant. What are the risks? Generally, this is a safe procedure. However, problems may occur, including:  Infection.  Bleeding.  Allergic reactions to medicines or dyes.  Damage to abdominal structures or organs, such as the intestines, liver, stomach, or spleen. What happens before the procedure? Staying hydrated Follow instructions from your health care provider about hydration, which may include:  Up to 2 hours before the procedure - you may continue to drink clear liquids, such as water, clear fruit juice, black coffee, and plain tea.   Eating and drinking restrictions Follow instructions from your health care provider about eating and drinking, which may include:  8 hours before the procedure - stop eating heavy meals or foods, such as meat, fried foods, or fatty foods.  6 hours before the procedure - stop eating light meals or foods, such as toast or cereal.  6 hours before the procedure - stop  drinking milk or drinks that contain milk.  2 hours before the procedure - stop drinking clear liquids. Medicines Ask your health care provider about:  Changing or stopping your regular medicines. This is especially important if you are taking diabetes medicines or blood thinners.  Taking medicines such as aspirin and ibuprofen. These medicines can thin your blood. Do not take these medicines unless your health care provider tells you to take them.  Taking over-the-counter medicines, vitamins, herbs, and supplements. General instructions  Ask your health care provider: ? How your surgery site will be marked. ? What steps will be taken to help prevent infection. These steps may include:  Removing hair at the surgery site.  Washing skin with a germ-killing soap.  Taking antibiotic medicine.  Plan to have a responsible adult take you home from the hospital or clinic.  Plan to have a responsible adult care for you for the time you are told after you leave the hospital or clinic. This is important. What happens during the procedure?  An IV will be inserted into one of your veins.  You will be given one or more of the following: ? A medicine to help you relax (sedative). ? A medicine to numb the area (local anesthetic). ? A medicine to make you fall asleep (general anesthetic).  A breathing tube will be placed down your throat to help you breathe during the procedure.  Your abdomen will be filled with an air-like gas so that your abdomen expands. This will give the surgeon more room to operate and will make  your organs easier to see.  Many small incisions will be made in your abdomen.  A laparoscope and other surgical instruments will be inserted into your abdomen through these incisions.  A biopsy may be done. This will depend on the reason why you are having this procedure.  The laparoscope and other instruments will be removed from your abdomen.  The air-like gas will be  released from your abdomen.  Your incisions will be closed with stitches (sutures), skin glue, or surgical tapes and covered with a bandage (dressing).  Your breathing tube will be removed. The procedure may vary among health care providers and hospitals.   What happens after the procedure?  Your blood pressure, heart rate, breathing rate, and blood oxygen level will be monitored until you leave the hospital or clinic.  If you were given a sedative during the procedure, it can affect you for several hours. Do not drive or operate machinery until your health care provider says that it is safe.  It is up to you to get the results of your procedure. Ask your health care provider, or the department that is doing the procedure, when your results will be ready. Summary  Diagnostic laparoscopy is a procedure to diagnose problems in the abdomen using a thin, flexible tube that has a light and a camera on the end (laparoscope).  Follow instructions from your health care provider about how to prepare for the procedure.  Plan to have a responsible adult care for you for the time you are told after you leave the hospital or clinic. This is important. This information is not intended to replace advice given to you by your health care provider. Make sure you discuss any questions you have with your health care provider. Document Revised: 02/27/2020 Document Reviewed: 02/27/2020 Elsevier Patient Education  2021 [employer].

## 2021-05-02 ENCOUNTER — Ambulatory Visit (INDEPENDENT_AMBULATORY_CARE_PROVIDER_SITE_OTHER): Payer: Medicaid Other

## 2021-05-02 ENCOUNTER — Other Ambulatory Visit: Payer: Self-pay

## 2021-05-02 DIAGNOSIS — Z23 Encounter for immunization: Secondary | ICD-10-CM | POA: Diagnosis not present

## 2021-05-02 NOTE — Progress Notes (Signed)
Patient presents to nurse clinic for flu vaccination. Administered in LD, site unremarkable, tolerated injection well.   Lucciano Vitali C Hancel Ion, RN   

## 2021-07-01 ENCOUNTER — Ambulatory Visit: Payer: Medicaid Other | Admitting: Family Medicine

## 2021-07-05 ENCOUNTER — Ambulatory Visit: Payer: Medicaid Other | Admitting: Family Medicine

## 2021-07-05 ENCOUNTER — Encounter: Payer: Self-pay | Admitting: Family Medicine

## 2021-07-05 ENCOUNTER — Other Ambulatory Visit: Payer: Self-pay

## 2021-07-05 VITALS — BP 122/88 | HR 80 | Ht 61.0 in | Wt 115.0 lb

## 2021-07-05 DIAGNOSIS — K219 Gastro-esophageal reflux disease without esophagitis: Secondary | ICD-10-CM | POA: Diagnosis not present

## 2021-07-05 MED ORDER — OMEPRAZOLE 20 MG PO CPDR: 20.0000 mg | DELAYED_RELEASE_CAPSULE | Freq: Every day | ORAL | 3 refills | Status: DC

## 2021-07-05 NOTE — Patient Instructions (Signed)
It was great seeing you today!  Today we discussed your stomach ache, I think this is due to gastroesophageal reflux or GERD. I have prescribed omeprazole 20 mg, please take this daily. Please try to avoid spicy foods, tomato containing foods and anything that you notice makes your reflux worse. Please make sure to leave at least a 3 hour gap in between your last meal of the day and bedtime.   Please try to limit the amount of NSAIDs, such as ibuprofen, that you take.   Please keep a food diary, list all the foods that you eat and make a note of when symptoms arise. Please bring this to your next visit.   Please follow up at your next scheduled appointment in 1 month, if anything arises between now and then, please don't hesitate to contact our office.   Thank you for allowing [place] to be a part of your medical care!  Thank you, Dr. Larae Grooms

## 2021-07-05 NOTE — Assessment & Plan Note (Signed)
-  most consistent with GERD, very less likely presence of other abdominal process  -omeprazole 20 mg daily prescribed  -GERD precautions discussed and encouraged to limit NSAID use to prevent development of ulceration  -instructed to maintain food diary and bring to next visit -follow up in 1 month, consider H pylori testing

## 2021-07-05 NOTE — Progress Notes (Signed)
° ° °  SUBJECTIVE:   CHIEF COMPLAINT / HPI:   Patient presents with epigastric pain for more than 2-3 years. Non radiating pain but gets a burning sensation that goes to her throat. Denies dysphagia, globulus sensation and vomiting. Endorses occasional nausea at times. Laying down makes her feel better. Aggravating factors include moving around. She has not noticed a pattern in what causes her pain. This pain seems constant every day. When asked, she reports that eating makes it worse. Mainly worsens with spicy foods. Not on any regular medications but takes tylenol occasionally for pain relief which does not help. Has BM daily.   OBJECTIVE:   BP 122/88    Pulse 80    Ht 5\' 1"  (1.549 m)    Wt 115 lb (52.2 kg)    LMP 06/27/2021    SpO2 100%    BMI 21.73 kg/m   General: Patient well-appearing, in no acute distress. HEENT: non-tender thyroid CV: RRR, no murmurs or gallops auscultated Resp: CTAB, no wheezing or rales noted Abdomen: soft, epigastric tenderness along deep palpation without tenderness in other quadrants, nondistended, presence of bowel sounds, no rebound tenderness or guarding, no evidence of organomegaly  Ext: radial pulses strong and equal bilaterally Neuro: normal gait Psych: mood appropriate   ASSESSMENT/PLAN:   GERD (gastroesophageal reflux disease) -most consistent with GERD, very less likely presence of other abdominal process  -omeprazole 20 mg daily prescribed  -GERD precautions discussed and encouraged to limit NSAID use to prevent development of ulceration  -instructed to maintain food diary and bring to next visit -follow up in 1 month, consider H pylori testing   -Med rec reviewed.   In-person interpretation by Humberto Leep utilized throughout the entirety of this encounter.  Donney Dice, [place]

## 2021-08-09 ENCOUNTER — Ambulatory Visit (INDEPENDENT_AMBULATORY_CARE_PROVIDER_SITE_OTHER): Payer: Medicaid Other | Admitting: Family Medicine

## 2021-08-09 ENCOUNTER — Other Ambulatory Visit: Payer: Self-pay

## 2021-08-09 ENCOUNTER — Encounter: Payer: Self-pay | Admitting: Family Medicine

## 2021-08-09 VITALS — BP 125/91 | HR 85 | Ht 61.0 in | Wt 118.8 lb

## 2021-08-09 DIAGNOSIS — K219 Gastro-esophageal reflux disease without esophagitis: Secondary | ICD-10-CM | POA: Diagnosis not present

## 2021-08-09 IMAGING — US US PELVIS COMPLETE
1 series · 13 of 25 positions shown · non-contrast
Comparison: None

CLINICAL DATA: RIGHT lower quadrant abdominal pain; LMP 07/15/2020

EXAM:
TRANSABDOMINAL ULTRASOUND OF PELVIS
TECHNIQUE: Transabdominal ultrasound examination of the pelvis was performed
including evaluation of the uterus, ovaries, adnexal regions, and
pelvic cul-de-sac. Transvaginal imaging was not ordered.

[Series 1: us pelvis complete · 0.19mm/px · 13 of 50 slices shown]
[im 1/50]
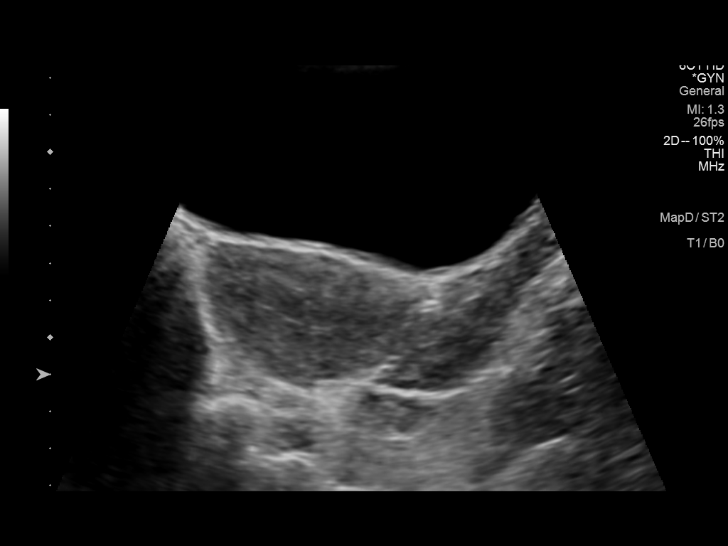
[im 5/50]
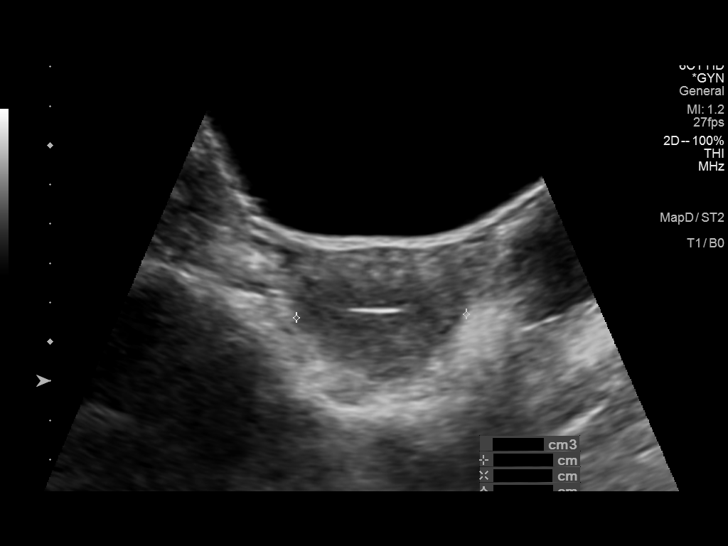
[im 9/50]
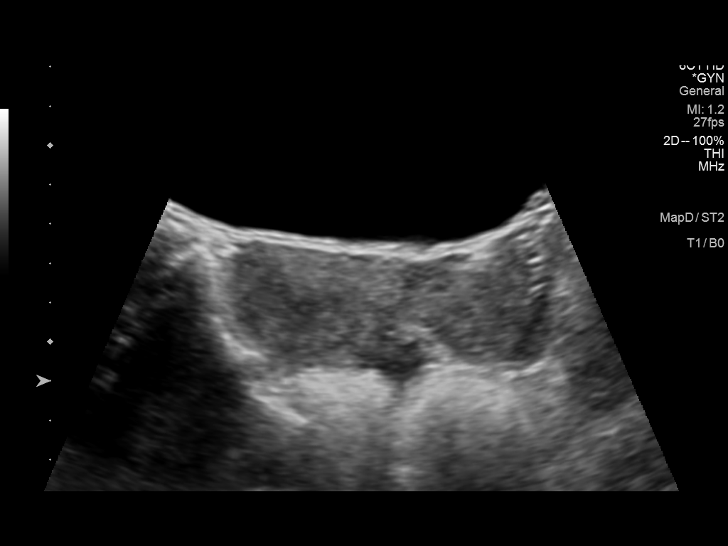
[im 13/50]
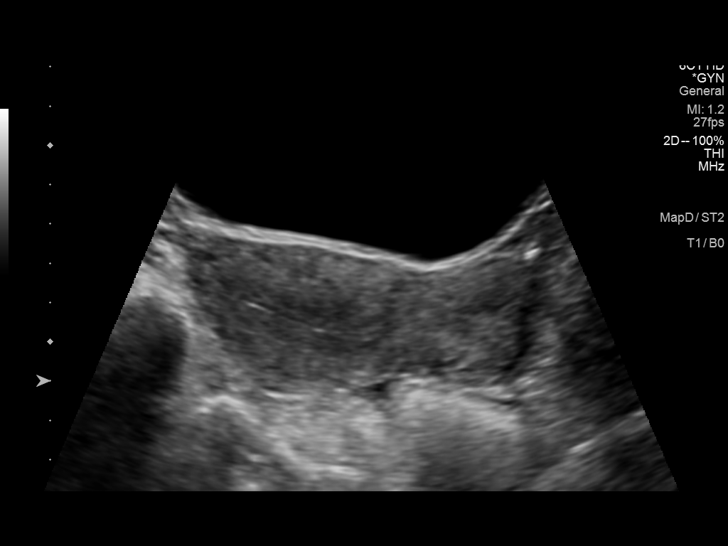
[im 17/50]
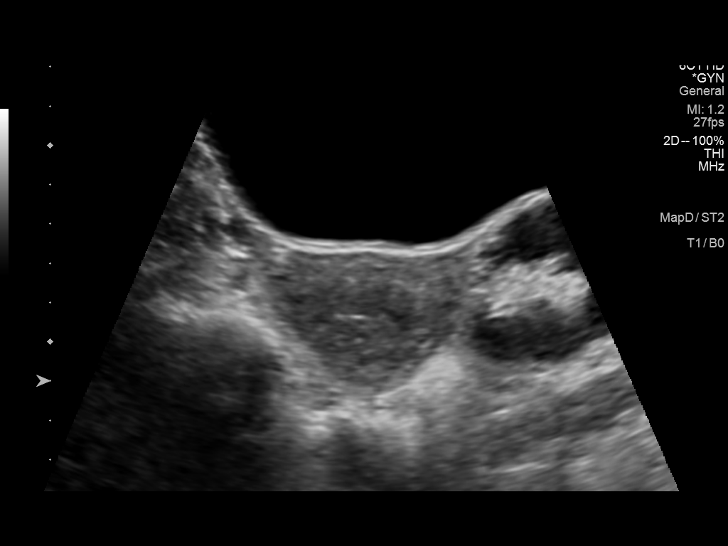
[im 21/50]
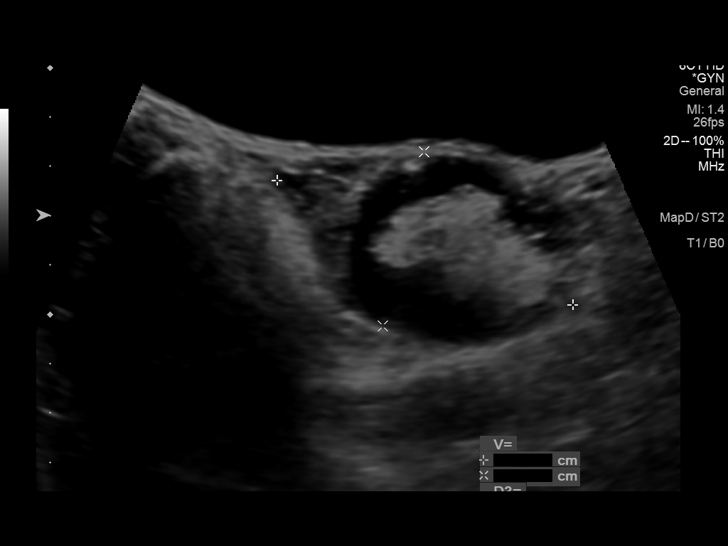
[im 25/50]
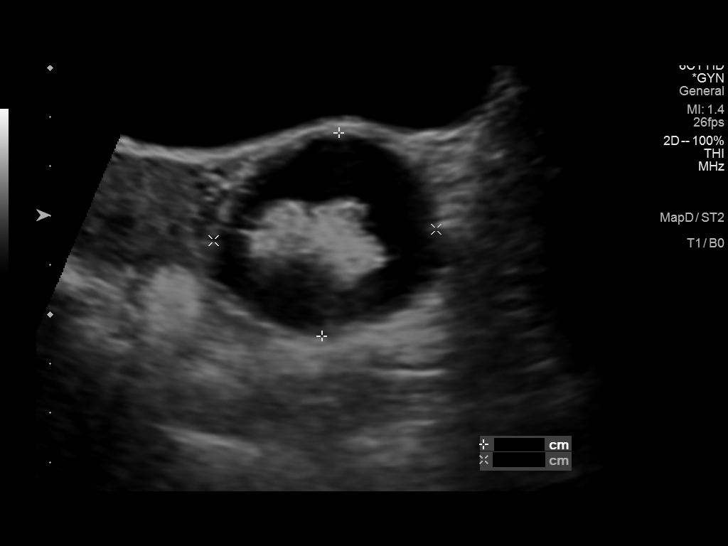
[im 29/50]
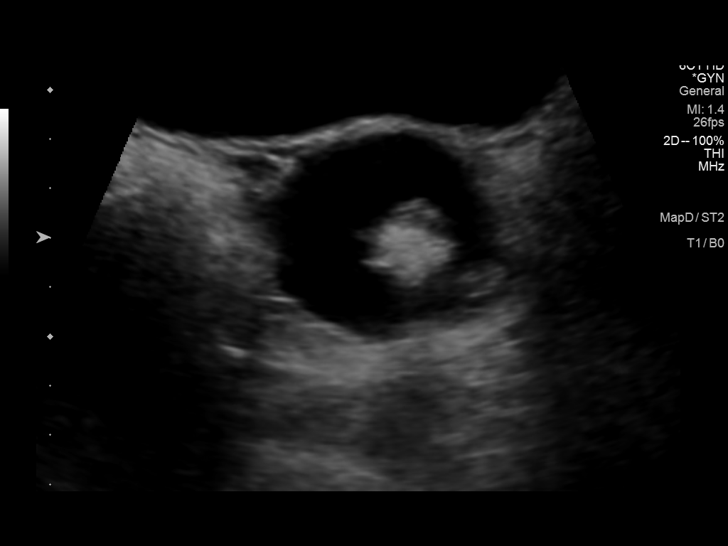
[im 33/50]
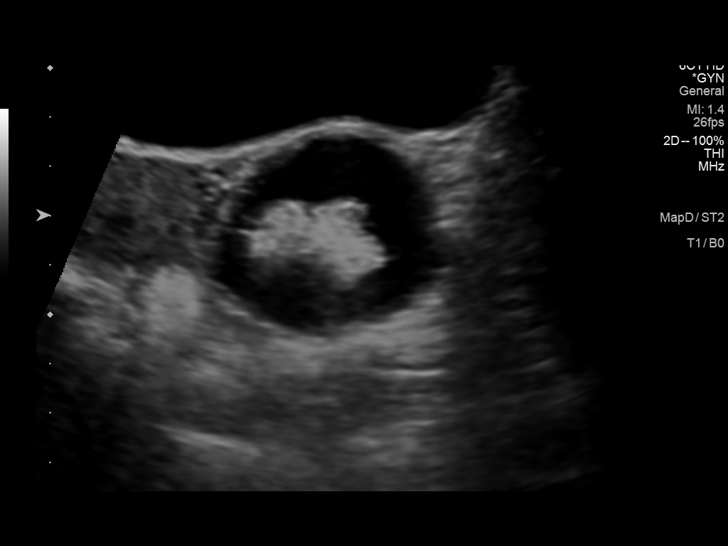
[im 37/50]
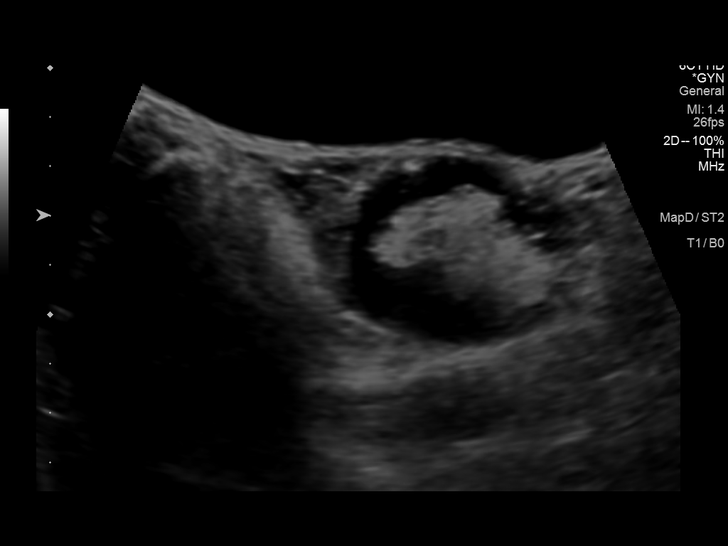
[im 41/50]
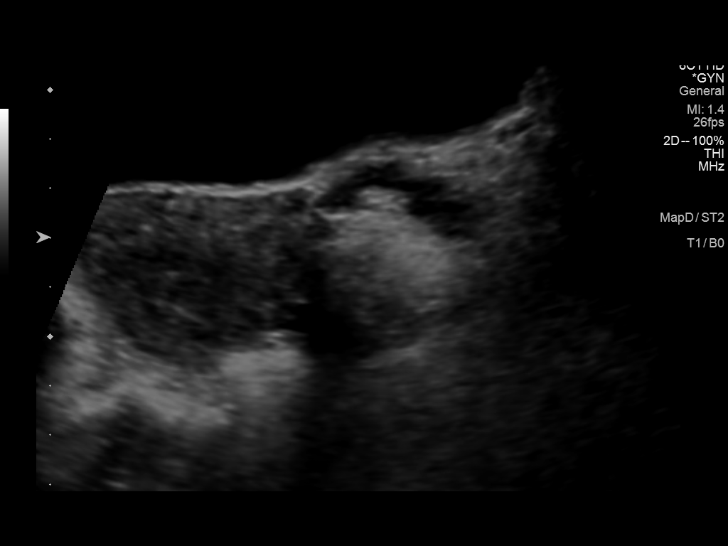
[im 45/50]
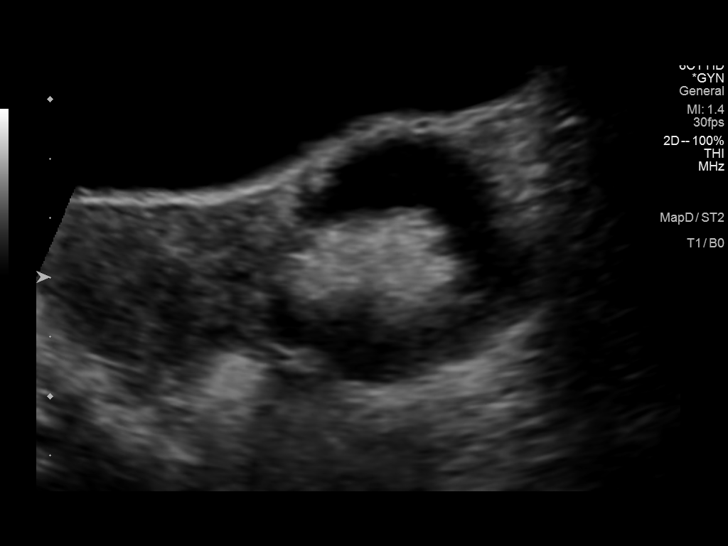
[im 50/50]
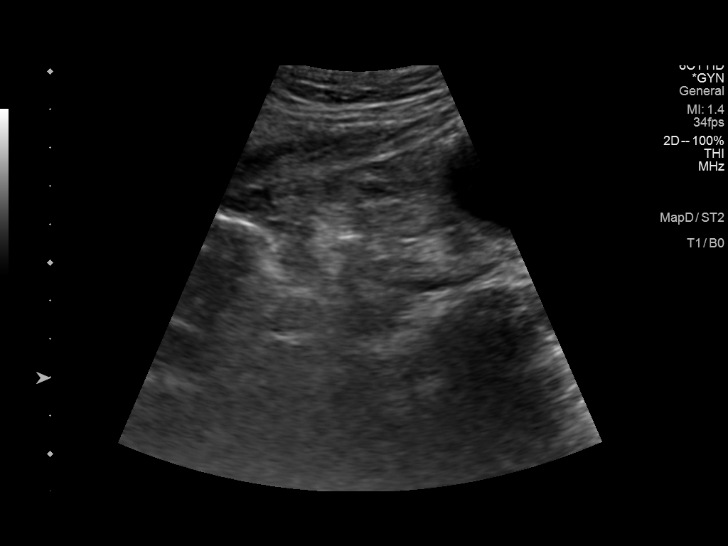

[13 of 25 positions shown; findings below may reference images not displayed]

FINDINGS: Uterus

Measurements: 8.0 x 4.3 x 4.3 cm = volume: 62 mL. Anteverted. Normal
morphology without mass

Endometrium

Thickness: 2 mm.  No endometrial fluid or focal abnormality

Right ovary

Not visualized, likely obscured by bowel

Left ovary

Measurements: 6.5 x 3.6 x 3.9 cm = volume: 48 mL. 5.2 x 4.1 x 4.5 cm
diameter complicated cystic lesion within LEFT ovary, containing a
hypoechoic component as well as and eccentric hyperechoic nodular
focus 3.8 cm in greatest size. Lesion is most consistent with a
dermoid tumor.

Other findings: No free pelvic fluid. No additional adnexal masses.
IMPRESSION: Unremarkable uterus and endometrial complex.

Nonvisualization of RIGHT ovary.

5.2 cm diameter complicated cystic lesion within LEFT ovary, with
imaging findings most consistent with a dermoid tumor.

Per BPR protocol based on size, characterization of this lesion by
MR imaging with and without contrast recommended.

## 2021-08-09 NOTE — Progress Notes (Signed)
° ° °  SUBJECTIVE:   CHIEF COMPLAINT / HPI:   Patient presents for abdominal pain follow up, thought to be GERD. Given omeprazole at last visit, has been taking omeprazole once daily in the morning. Instructed to maintain food diary but shares that she was not able to do so. She used to eat plenty of spicy foods but has limited this amount since the last visit. Denies eating any tomato products as well. Eats dinner around 5pm and goes to bed around 9pm. Reports epigastric pain with occasional radiating pain to the back. Feels heart burn and a burning sensation. Rates the pain as a 7/10 today, the pain remains constant throughout the day. Denies any particular relieivng or aggravating factors. Denies dysphagia or other symptoms.   OBJECTIVE:   BP (!) 125/91    Pulse 85    Ht 5\' 1"  (1.549 m)    Wt 118 lb 12.8 oz (53.9 kg)    LMP 08/03/2021    SpO2 100%    BMI 22.45 kg/m   General: Patient well-appearing, in no acute distress. CV: RRR, no murmurs or gallops auscultated Resp: CTAB Abdomen: soft, localized epigastric tenderness upon deep palpation, nondistended, no rebound tenderness or evidence of organomegaly, presence of bowel sounds  ASSESSMENT/PLAN:   GERD (gastroesophageal reflux disease) -pain likely seems consistent with GERD however H pylori, gastritis and pancreatitis also on the differential  -discontinued omeprazole as it has not provided relief  -Gaviscon for pain relief -will obtain CMP, CBC, amylase and lipase. Will notify patient of any abnormal results -follow up in 4 weeks for H pylori testing, may consider pepcid after testing if pain persists and possibly GI referral for EGD     PHQ-9 score of 0 reviewed.   In-person interpretation by Humberto Leep utilized throughout the entirety of this encounter   Donney Dice, [place]

## 2021-08-09 NOTE — Assessment & Plan Note (Signed)
-  pain likely seems consistent with GERD however H pylori, gastritis and pancreatitis also on the differential  -discontinued omeprazole as it has not provided relief  -Gaviscon for pain relief -will obtain CMP, CBC, amylase and lipase. Will notify patient of any abnormal results -follow up in 4 weeks for H pylori testing, may consider pepcid after testing if pain persists and possibly GI referral for EGD

## 2021-08-09 NOTE — Patient Instructions (Addendum)
° °  You can use up to 4 times per day   It was great seeing you today!  I am sorry that you are still having pain. We will get some blood work today, I will let you know of any abnormal results. Please stop taking omeprazole since it is not helping and take the above medication Gaviscon instead.   Please follow up at your next scheduled appointment in 4 weeks, if anything arises between now and then, please don't hesitate to contact our office.   Thank you for allowing [place] to be a part of your medical care!  Thank you, Dr. Larae Grooms

## 2021-08-10 LAB — COMPREHENSIVE METABOLIC PANEL
ALT: 26 IU/L (ref 0–32)
AST: 24 IU/L (ref 0–40)
Albumin/Globulin Ratio: 2 (ref 1.2–2.2)
Albumin: 5.1 g/dL — ABNORMAL HIGH (ref 3.8–4.8)
Alkaline Phosphatase: 67 IU/L (ref 44–121)
BUN/Creatinine Ratio: 21 (ref 9–23)
BUN: 11 mg/dL (ref 6–24)
Bilirubin Total: 0.4 mg/dL (ref 0.0–1.2)
CO2: 22 mmol/L (ref 20–29)
Calcium: 9.6 mg/dL (ref 8.7–10.2)
Chloride: 103 mmol/L (ref 96–106)
Creatinine, Ser: 0.52 mg/dL — ABNORMAL LOW (ref 0.57–1.00)
Globulin, Total: 2.5 g/dL (ref 1.5–4.5)
Glucose: 77 mg/dL (ref 70–99)
Potassium: 4.6 mmol/L (ref 3.5–5.2)
Sodium: 140 mmol/L (ref 134–144)
Total Protein: 7.6 g/dL (ref 6.0–8.5)
eGFR: 118 mL/min/{1.73_m2} (ref >59)

## 2021-08-10 LAB — CBC
Hematocrit: 41.2 % (ref 34.0–46.6)
Hemoglobin: 12.3 g/dL (ref 11.1–15.9)
MCH: 21.8 pg — ABNORMAL LOW (ref 26.6–33.0)
MCHC: 29.9 g/dL — ABNORMAL LOW (ref 31.5–35.7)
MCV: 73 fL — ABNORMAL LOW (ref 79–97)
Platelets: 397 10*3/uL (ref 150–450)
RBC: 5.64 x10E6/uL — ABNORMAL HIGH (ref 3.77–5.28)
RDW: 15.4 % (ref 11.7–15.4)
WBC: 6.3 10*3/uL (ref 3.4–10.8)

## 2021-08-10 LAB — LIPASE: Lipase: 27 U/L (ref 14–72)

## 2021-08-10 LAB — AMYLASE: Amylase: 96 U/L (ref 31–110)

## 2021-09-16 ENCOUNTER — Ambulatory Visit: Payer: Medicaid Other | Admitting: Family Medicine

## 2021-09-16 ENCOUNTER — Encounter: Payer: Self-pay | Admitting: Family Medicine

## 2021-09-16 ENCOUNTER — Other Ambulatory Visit: Payer: Self-pay

## 2021-09-16 VITALS — BP 125/80 | HR 77 | Ht 61.0 in | Wt 120.2 lb

## 2021-09-16 DIAGNOSIS — K219 Gastro-esophageal reflux disease without esophagitis: Secondary | ICD-10-CM | POA: Diagnosis not present

## 2021-09-16 MED ORDER — FAMOTIDINE 20 MG PO TABS: 20.0000 mg | ORAL_TABLET | Freq: Every day | ORAL | 0 refills | Status: DC

## 2021-09-16 NOTE — Patient Instructions (Addendum)
It was great seeing you today! ? ?Today we discussed your abdominal pain which I still is from reflux but we want to test for a bacteria called H pylori that can also worsen these symptoms. I will let you know of any abnormal results and give you treatment if indicated.  ? ?Please take pepcid for your symptoms, please take this daily.  ? ?Please follow up at your next scheduled appointment in 2 weeks, if anything arises between now and then, please don't hesitate to contact our office. ? ? ?Thank you for allowing [place] to be a part of your medical care! ? ?Thank you, ?Dr. Larae Grooms  ?

## 2021-09-16 NOTE — Assessment & Plan Note (Signed)
-  H pylori testing performed today ?-pepcid prescribed daily ?-follow up in 2 weeks, consider GI referral at this time for possible EGD  ?

## 2021-09-16 NOTE — Progress Notes (Signed)
? ? ?  SUBJECTIVE:  ? ?CHIEF COMPLAINT / HPI:  ? ?Patient presents for follow up of epigastric pain, likely secondary to reflux. Trial of omeprazole was not successful so recommended to try gaviscon for pain relief. Omeprazole discontinued in anticipation of h pylori testing today. Patient reports some relief with medication, but still endorsing the same abdominal pain.  ? ?OBJECTIVE:  ? ?BP 125/80   Pulse 77   Ht 5\' 1"  (1.549 m)   Wt 120 lb 3.2 oz (54.5 kg)   LMP 09/16/2021   SpO2 98%   BMI 22.71 kg/m?   ?General: Patient well-appearing, in no acute distress. ?CV: RRR, no murmurs or gallops auscultated ?Resp: CTAB ?Abdomen: soft, generalized tenderness upon deep palpation worsened along epigastric region, nondistended, no rebound tenderness or guarding noted, presence of bowel sounds ? ?ASSESSMENT/PLAN:  ? ?GERD (gastroesophageal reflux disease) ?-H pylori testing performed today ?-pepcid prescribed daily ?-follow up in 2 weeks, consider GI referral at this time for possible EGD  ?  ?-Instructed to follow up with PCP for physical and mammogram screening as patient has never had mammogram in the past.  ? ? ?Audio Montagnard interpretation utilized throughout the entirety of this encounter.  ? ?Donney Dice, DO ?[place]  ?

## 2021-09-18 LAB — H. PYLORI BREATH TEST: H pylori Breath Test: NEGATIVE

## 2021-09-19 ENCOUNTER — Encounter: Payer: Self-pay | Admitting: Family Medicine

## 2021-09-28 ENCOUNTER — Encounter: Payer: Self-pay | Admitting: Family Medicine

## 2021-09-28 ENCOUNTER — Ambulatory Visit: Payer: Medicaid Other | Admitting: Family Medicine

## 2021-09-28 ENCOUNTER — Other Ambulatory Visit: Payer: Self-pay

## 2021-09-28 VITALS — BP 128/76 | HR 74 | Ht 61.0 in | Wt 122.2 lb

## 2021-09-28 DIAGNOSIS — Z131 Encounter for screening for diabetes mellitus: Secondary | ICD-10-CM | POA: Diagnosis not present

## 2021-09-28 DIAGNOSIS — Z1159 Encounter for screening for other viral diseases: Secondary | ICD-10-CM

## 2021-09-28 DIAGNOSIS — Z13220 Encounter for screening for lipoid disorders: Secondary | ICD-10-CM

## 2021-09-28 DIAGNOSIS — K219 Gastro-esophageal reflux disease without esophagitis: Secondary | ICD-10-CM

## 2021-09-28 LAB — POCT GLYCOSYLATED HEMOGLOBIN (HGB A1C): Hemoglobin A1C: 5 % (ref 4.0–5.6)

## 2021-09-28 NOTE — Progress Notes (Signed)
? ? ? ?  SUBJECTIVE:  ? ? ?A jarai speaking interpretor was used for this encounter.   ? ?Chief compliant/HPI: annual examination ? ?Cassandra Lee is a 41 y.o. who presents today for an annual exam.  ? ?History tabs reviewed and updated.  ? ?Review of systems form reviewed and negative ? ?GERD  ?Seen 2 weeks ago for reflux symptoms. H. Pylori negative at this time. She was prescribed pepcid 5m which has been helping, she no longer has symptoms of reflux or abdominal pain.  ? ?OBJECTIVE:  ? ?BP 128/76   Pulse 74   Ht 5' 1" (1.549 m)   Wt 122 lb 3.2 oz (55.4 kg)   LMP 09/16/2021 (Exact Date)   SpO2 99%   BMI 23.09 kg/m?   ? ?General: Alert, no acute distress ?Cardio: Normal S1 and S2, RRR, no r/m/g ?Pulm: CTAB, normal work of breathing ?Abdomen: Bowel sounds normal. Abdomen soft and non-tender.  ?Extremities: No peripheral edema.  ?Neuro: Cranial nerves grossly intact ? ?ASSESSMENT/PLAN:  ? ?GERD (gastroesophageal reflux disease) ?Stable, continue pepcid. F/u as needed. If sx worsens consider EGD.  ?  [place] ?PHQ-9 Total Score 0  ? ?  ?  ?Annual Examination  ?See AVS for age appropriate recommendations.   ?PHQ score 0, reviewed and discussed.  ?Blood pressure reviewed and at goal.  ?Asked about intimate partner violence and resources given as appropriate  ?The patient currently uses BLT for contraception.  ? ?Considered the following items based upon USPSTF recommendations: ?Diabetes screening: ordered ?Screening for elevated cholesterol: ordered ?HIV testing: ordered ?Hepatitis C: ordered ?Hepatitis B: ordered ?Syphilis if at high risk: not at risk  ?GC/CT not at high risk and not ordered. ?Reviewed risk factors for latent tuberculosis and not indicated ?Reviewed risk factors for osteoporosis. Using FRAX tool estimated risk of major osteoporotic fracture of  2.1%, early screening not ordered ? ? ?Discussed family history, BRCA testing not  indicated.  ?Cervical cancer screening: prior pap review, next due in 2025 ?Breast cancer screening: did not order mammogram today, unable to discuss due to interpretor leaving encounter early. ?Colorectal cancer screening: discussed and declined today.  if age 1696or over.  ? ?Follow up in 1 year or sooner if indicated.  ? ? ?PLattie Haw MD ?[place]  ?

## 2021-09-28 NOTE — Patient Instructions (Signed)
Thank you for coming to see me today. It was a pleasure. Today we discussed your abdominal pain. I recommend continuing the pepcid. If the pain comes back please come back and see [place], we can consider referring you to a stomach doctor and you may need a camera test in the stomach. ? ?We will get some labs today.  If they are abnormal or we need to do something about them, I will call you.  If they are normal, I will send you a message on MyChart (if it is active) or a letter in the mail.  If you don't hear from [place] in 2 weeks, please call the office at the number below.  ? ?Please follow-up with me as and when needed. ? ?If you have any questions or concerns, please do not hesitate to call the office at [phone]. ? ?Best wishes,  ? ?Dr Posey Pronto   ?

## 2021-09-29 LAB — LIPID PANEL
Chol/HDL Ratio: 3.2 ratio (ref 0.0–4.4)
Cholesterol, Total: 205 mg/dL — ABNORMAL HIGH (ref 100–199)
HDL: 65 mg/dL (ref >39)
LDL Chol Calc (NIH): 120 mg/dL — ABNORMAL HIGH (ref 0–99)
Triglycerides: 113 mg/dL (ref 0–149)
VLDL Cholesterol Cal: 20 mg/dL (ref 5–40)

## 2021-09-29 LAB — HEPATITIS B SURFACE ANTIGEN: Hepatitis B Surface Ag: NEGATIVE

## 2021-09-29 LAB — HIV ANTIBODY (ROUTINE TESTING W REFLEX): HIV Screen 4th Generation wRfx: NONREACTIVE

## 2021-09-29 LAB — HEPATITIS C ANTIBODY: Hep C Virus Ab: NONREACTIVE

## 2021-09-29 NOTE — Addendum Note (Signed)
Addended by: Thana Farr on: 09/29/2021 08:29 AM ? ? Modules accepted: Level of Service ? ?

## 2021-09-29 NOTE — Assessment & Plan Note (Signed)
Stable, continue pepcid. F/u as needed. If sx worsens consider EGD.  ?

## 2021-10-13 ENCOUNTER — Other Ambulatory Visit: Payer: Self-pay | Admitting: Family Medicine

## 2021-10-13 DIAGNOSIS — K219 Gastro-esophageal reflux disease without esophagitis: Secondary | ICD-10-CM

## 2022-01-10 ENCOUNTER — Ambulatory Visit: Payer: Medicaid Other | Admitting: Family Medicine

## 2022-01-10 ENCOUNTER — Other Ambulatory Visit (HOSPITAL_COMMUNITY)
Admission: RE | Admit: 2022-01-10 | Discharge: 2022-01-10 | Disposition: A | Discharge status: Home / Self Care | Payer: Medicaid Other | Source: Ambulatory Visit | Attending: Family Medicine | Admitting: Family Medicine

## 2022-01-10 ENCOUNTER — Encounter: Payer: Self-pay | Admitting: Family Medicine

## 2022-01-10 VITALS — BP 110/70 | HR 80 | Ht 61.0 in | Wt 124.0 lb

## 2022-01-10 DIAGNOSIS — N3000 Acute cystitis without hematuria: Secondary | ICD-10-CM

## 2022-01-10 DIAGNOSIS — Z114 Encounter for screening for human immunodeficiency virus [HIV]: Secondary | ICD-10-CM | POA: Diagnosis not present

## 2022-01-10 DIAGNOSIS — Z3200 Encounter for pregnancy test, result unknown: Secondary | ICD-10-CM

## 2022-01-10 DIAGNOSIS — Z113 Encounter for screening for infections with a predominantly sexual mode of transmission: Secondary | ICD-10-CM

## 2022-01-10 DIAGNOSIS — N949 Unspecified condition associated with female genital organs and menstrual cycle: Secondary | ICD-10-CM

## 2022-01-10 DIAGNOSIS — R300 Dysuria: Secondary | ICD-10-CM | POA: Diagnosis not present

## 2022-01-10 DIAGNOSIS — N390 Urinary tract infection, site not specified: Secondary | ICD-10-CM | POA: Insufficient documentation

## 2022-01-10 DIAGNOSIS — N898 Other specified noninflammatory disorders of vagina: Secondary | ICD-10-CM

## 2022-01-10 LAB — POCT URINALYSIS DIP (CLINITEK)
Bilirubin, UA: NEGATIVE
Blood, UA: NEGATIVE
Glucose, UA: NEGATIVE mg/dL
Ketones, POC UA: NEGATIVE mg/dL
Nitrite, UA: NEGATIVE
POC PROTEIN,UA: NEGATIVE
Spec Grav, UA: 1.01 (ref 1.010–1.025)
Urobilinogen, UA: 0.2 E.U./dL
pH, UA: 7 (ref 5.0–8.0)

## 2022-01-10 LAB — POCT WET PREP (WET MOUNT)
Clue Cells Wet Prep Whiff POC: NEGATIVE
Trichomonas Wet Prep HPF POC: ABSENT

## 2022-01-10 LAB — POCT UA - MICROSCOPIC ONLY: Epithelial cells, urine per micros: NONE SEEN

## 2022-01-10 LAB — POCT URINE PREGNANCY: Preg Test, Ur: NEGATIVE

## 2022-01-10 MED ORDER — CEPHALEXIN 500 MG PO CAPS: 500.0000 mg | ORAL_CAPSULE | Freq: Two times a day (BID) | ORAL | 0 refills | Status: AC

## 2022-01-10 NOTE — Assessment & Plan Note (Addendum)
41 y.o. female with vaginal discharge for 2 weeks days, as well as pain. Wet prep performed today shows no yeast, BV, trichomonas.  Patient is interested in STI screening.   Plan: -Wet prep as above.   -GC/chlamydia pending -Will check HIV and RPR

## 2022-01-10 NOTE — Progress Notes (Signed)
    SUBJECTIVE:   CHIEF COMPLAINT / HPI:   Vaginal irritation, discharge: Patient is a 41 y.o. female presenting with vaginal discharge for 2 weeks. She states the discharge is of thin consistency.  She endorses no vaginal odor.  She also has had dysuria, frequency, no urgency. She is interested in screening for sexually transmitted infections today. She is UTD with pap smear, last NILM, HPV neg 07/2020, next due 07/2023. She has had a BTL. LMP 12/26/21.  PERTINENT  PMH / PSH: None relevant  OBJECTIVE:   BP 110/70   Pulse 80   Ht '5\' 1"'$  (1.549 m)   Wt 124 lb (56.2 kg)   LMP 12/26/2021   SpO2 99%   BMI 23.43 kg/m    General: NAD, pleasant, able to participate in exam Respiratory: Normal effort, no obvious respiratory distress Abdomen: no CVA tenderness Pelvic: VULVA: normal appearing vulva with no masses, tenderness or lesions, VAGINA: Normal appearing vagina with normal color, no lesions, with scant and clear discharge present, CERVIX: No lesions, scant and clear discharge present  ASSESSMENT/PLAN:   UTI (urinary tract infection) Patient with dysuria, no fevers/chills or CVA tenderness. UA with + leuks, will treat for cystitis with keflex 500 mg BID x7d. Discussed return precautions for sign of pyelnephritis such as fever, chills, back pain.  Vaginal discomfort 41 y.o. female with vaginal discharge for 2 weeks days, as well as pain. Wet prep performed today shows no yeast, BV, trichomonas.  Patient is interested in STI screening.   Plan: -Wet prep as above.   -GC/chlamydia pending -Will check HIV and RPR      Gladys Damme, MD [place]

## 2022-01-10 NOTE — Progress Notes (Deleted)
err

## 2022-01-11 LAB — CERVICOVAGINAL ANCILLARY ONLY
Chlamydia: NEGATIVE
Comment: NEGATIVE
Comment: NORMAL
Neisseria Gonorrhea: NEGATIVE

## 2022-01-11 LAB — RPR: RPR Ser Ql: NONREACTIVE

## 2022-01-11 LAB — HIV ANTIBODY (ROUTINE TESTING W REFLEX): HIV Screen 4th Generation wRfx: NONREACTIVE

## 2022-01-12 LAB — URINE CULTURE

## 2022-01-23 ENCOUNTER — Telehealth: Payer: Self-pay | Admitting: Family Medicine

## 2022-01-23 NOTE — Telephone Encounter (Signed)
Called patient with Cassandra Lee interpreter to discuss negative results. All results are negative. Patient did not answer, left HIPAA compliant VM.  Gladys Damme, MD [facility], PGY-3

## 2022-02-06 ENCOUNTER — Ambulatory Visit: Payer: Medicaid Other | Admitting: Student

## 2022-02-06 ENCOUNTER — Encounter: Payer: Self-pay | Admitting: Student

## 2022-02-06 VITALS — BP 110/68 | HR 81 | Ht 61.0 in | Wt 125.0 lb

## 2022-02-06 DIAGNOSIS — R300 Dysuria: Secondary | ICD-10-CM | POA: Diagnosis not present

## 2022-02-06 DIAGNOSIS — N949 Unspecified condition associated with female genital organs and menstrual cycle: Secondary | ICD-10-CM | POA: Diagnosis not present

## 2022-02-06 DIAGNOSIS — N390 Urinary tract infection, site not specified: Secondary | ICD-10-CM

## 2022-02-06 DIAGNOSIS — B9620 Unspecified Escherichia coli [E. coli] as the cause of diseases classified elsewhere: Secondary | ICD-10-CM

## 2022-02-06 LAB — POCT URINALYSIS DIP (MANUAL ENTRY)
Bilirubin, UA: NEGATIVE
Glucose, UA: NEGATIVE mg/dL
Ketones, POC UA: NEGATIVE mg/dL
Leukocytes, UA: NEGATIVE
Nitrite, UA: NEGATIVE
Protein Ur, POC: NEGATIVE mg/dL
Spec Grav, UA: 1.01 (ref 1.010–1.025)
Urobilinogen, UA: 0.2 E.U./dL
pH, UA: 7 (ref 5.0–8.0)

## 2022-02-06 LAB — POCT UA - MICROSCOPIC ONLY: WBC, Ur, HPF, POC: NONE SEEN (ref 0–5)

## 2022-02-06 MED ORDER — FLUCONAZOLE 150 MG PO TABS: 150.0000 mg | ORAL_TABLET | Freq: Once | ORAL | 0 refills | Status: AC

## 2022-02-06 NOTE — Progress Notes (Signed)
    SUBJECTIVE:   CHIEF COMPLAINT / HPI:   Dysuria Patient recently treated for UTI 1 month ago with 7 days of Keflex. UA at the time with 2+ leukocytes, culture was negative except for ordinary urogenital flora. Patient reports symptoms abated and then returned last Thursday with dysuria. No urgency or frequency. Reports both "inside" burning in her abdomen and "outside" burning where her urine feels "hot" on her labia. Denies any itching or discharge. No changes in color/odor to her urine. No fevers or flank pain, no abdominal pain that is not related to urination.   PERTINENT  PMH / PSH: Hx GERD with neg H Pylori.   OBJECTIVE:   BP 110/68   Pulse 81   Ht '5\' 1"'$  (1.549 m)   Wt 125 lb (56.7 kg)   LMP 01/18/2022   SpO2 100%   BMI 23.62 kg/m   Physical Exam Vitals reviewed.  Constitutional:      General: She is not in acute distress. Pulmonary:     Effort: Pulmonary effort is normal.  Abdominal:     General: Abdomen is flat. There is no distension.     Palpations: Abdomen is soft. There is no mass.     Tenderness: There is no abdominal tenderness. There is no guarding.  Neurological:     General: No focal deficit present.  Psychiatric:        Mood and Affect: Mood normal.      ASSESSMENT/PLAN:   Dysuria UA with only trace blood (0-2 RBC/HPF on microscopy). No leukocytes/nitrites/WBC. In the absence of fevers or other UTI symptoms, unlikely to represent true infectious cystitis but will send urine culture. Given timeline of symptoms, question whether she has developed a yeast infection related to her initial course of abx that is not causing some external irritation that cuold explain the external dysuria and few RBC on microscopy--will plan to treat empirically to cover this.  - F/u urine culture - Diflucan x1 - Ok to try OTC pyridium if non-infectious dysuria persists     Pearla Dubonnet, MD [place]

## 2022-02-06 NOTE — Patient Instructions (Addendum)
I'm sorry you are having pain with urination. Your urine does not look to be infected on our initial test here in the lab. I am sending it off for culture and will call you if it is positive for anything. It sounds like perhaps you have a yeast infection possibly related to your previous course of antibiotics, I'd like to treat this with a one-time dose of Diflucan that I am sending to your pharmacy. You are welcome to try some Pyridium which is a medication available over the counter that can help with pain. If your symptoms get worse or if you start having fevers, please come back to see [place].   Pearla Dubonnet, MD

## 2022-02-06 NOTE — Assessment & Plan Note (Signed)
UA with only trace blood (0-2 RBC/HPF on microscopy). No leukocytes/nitrites/WBC. In the absence of fevers or other UTI symptoms, unlikely to represent true infectious cystitis but will send urine culture. Given timeline of symptoms, question whether she has developed a yeast infection related to her initial course of abx that is not causing some external irritation that cuold explain the external dysuria and few RBC on microscopy--will plan to treat empirically to cover this.  - F/u urine culture - Diflucan x1 - Ok to try OTC pyridium if non-infectious dysuria persists

## 2022-02-09 LAB — URINE CULTURE

## 2022-02-13 MED ORDER — CEFDINIR 300 MG PO CAPS: 300.0000 mg | ORAL_CAPSULE | Freq: Two times a day (BID) | ORAL | 0 refills | Status: DC

## 2022-02-13 NOTE — Addendum Note (Signed)
Addended by: Jim Like B on: 02/13/2022 08:54 AM   Modules accepted: Orders

## 2022-04-12 ENCOUNTER — Ambulatory Visit: Payer: Medicaid Other | Admitting: Student

## 2022-04-12 ENCOUNTER — Other Ambulatory Visit: Payer: Self-pay

## 2022-04-12 ENCOUNTER — Encounter: Payer: Self-pay | Admitting: Student

## 2022-04-12 VITALS — BP 135/89 | HR 86 | Wt 128.0 lb

## 2022-04-12 DIAGNOSIS — K219 Gastro-esophageal reflux disease without esophagitis: Secondary | ICD-10-CM | POA: Diagnosis not present

## 2022-04-12 DIAGNOSIS — F419 Anxiety disorder, unspecified: Secondary | ICD-10-CM | POA: Diagnosis not present

## 2022-04-12 MED ORDER — FAMOTIDINE 20 MG PO TABS: 20.0000 mg | ORAL_TABLET | Freq: Every day | ORAL | 3 refills | Status: DC

## 2022-04-12 MED ORDER — ESCITALOPRAM OXALATE 10 MG PO TABS: 10.0000 mg | ORAL_TABLET | Freq: Every day | ORAL | 5 refills | Status: DC

## 2022-04-12 NOTE — Assessment & Plan Note (Signed)
Worsening over the past several months.  Now interfering with her day-to-day activities.  Nutrini with her ability to get out of the house and detracting from her quality of life at home given jumpiness.  Would like to pursue multimodal therapy with counseling and medication.  Given her language barrier, will be challenging to find the right fit for her.  Will place referral to community care coordination to help get her set up with a therapist to suits her needs. -We will start Lexapro 10 mg -Follow-up in 4 weeks as above

## 2022-04-12 NOTE — Assessment & Plan Note (Addendum)
Suspect that symptoms today do represent a recurrence of her GERD symptoms in the setting of having run out of her Pepcid.  I do note that given her [place], she should be considered at increased risk for gastric cancer.  However without constitutional symptoms and given the temporal association with discontinuation of Pepcid, I think it is reasonable to trial restarting her Pepcid at this time and only pursuing further work-up for possible gastric cancer if she fails to improve on the Pepcid or if she develops new symptoms such as weight loss, night sweats. -Pepcid 20 mg daily -Follow-up in 4 weeks

## 2022-04-12 NOTE — Progress Notes (Signed)
SUBJECTIVE:   CHIEF COMPLAINT / HPI:   Upper Abdominal Pain Patient describes a 1 month history of progressive upper abdominal pain that sometimes radiates to her back or up into her chest.  She had similar symptoms several months ago and was diagnosed with GERD and was started on Pepcid which did help however she recently ran out of her Pepcid and symptoms have since recurred.  Note that she did have H. pylori testing at that time which was negative.  She believes that this is recurrence of her GERD but would like to rule out other, scarier causes.  She is quite anxious about this, especially concerned that it may be related to her heart.  Symptoms are worse at night and after a large meal.  She denies any increased nausea, belching.  She is able to eat and drink normally and has not lost any weight recently.  No fever, night sweats.  Her bowel movements and urinary habits are normal at this time.  She is not currently taking any medications.  Anxious State Patient reports feeling scared and on edge all the time.  Even the phone drinking will make her feel jumpy and frightened.  She reports a fear of going out of the house and expresses feeling panicky when among crowds.  These are relatively new symptoms for her and have been slowly progressive over the last several months.  She has not been treated for anxiety previously.   OBJECTIVE:   BP 135/89   Pulse 86   Wt 128 lb (58.1 kg)   SpO2 100%   BMI 24.19 kg/m   Physical Exam Vitals reviewed.  Constitutional:      General: She is not in acute distress. Cardiovascular:     Rate and Rhythm: Normal rate and regular rhythm.     Heart sounds: No murmur heard. Pulmonary:     Effort: Pulmonary effort is normal.     Breath sounds: Normal breath sounds.  Abdominal:     General: Abdomen is flat.     Comments: Mild tender to palpation in epigastric region, no CVA tenderness, no lower quadrant abdominal pain.  No rebound or guarding   Skin:    General: Skin is warm.  Psychiatric:        Mood and Affect: Mood is anxious.      ASSESSMENT/PLAN:   GERD (gastroesophageal reflux disease) Suspect that symptoms today do represent a recurrence of her GERD symptoms in the setting of having run out of her Pepcid.  I do note that given her [place], she should be considered at increased risk for gastric cancer.  However without constitutional symptoms and given the temporal association with discontinuation of Pepcid, I think it is reasonable to trial restarting her Pepcid at this time and only pursuing further work-up for possible gastric cancer if she fails to improve on the Pepcid or if she develops new symptoms such as weight loss, night sweats. -Pepcid 20 mg daily -Follow-up in 4 weeks  Anxiety Worsening over the past several months.  Now interfering with her day-to-day activities.  [place] with her ability to get out of the house and detracting from her quality of life at home given jumpiness.  Would like to pursue multimodal therapy with counseling and medication.  Given her language barrier, will be challenging to find the right fit for her.  Will place referral to community care coordination to help get her set up with a therapist to suits her needs. -  We will start Lexapro 10 mg -Follow-up in 4 weeks as above     Pearla Dubonnet, MD [place]

## 2022-04-12 NOTE — Patient Instructions (Addendum)
Ms. [place]lun,  It is wonderful to see you today!  I am so sorry to hear about the symptoms that you have been having.  I think that your symptoms are due to gastric reflux and that we should be able to treat them by restarting your Pepcid.  However, I would only like to try this for the next 3 to 4 weeks and if your symptoms are not better, we may need to go looking for more insidious causes of your symptoms. For your anxiety, I am going to send in a prescription for a medication called Lexapro.  You should take this every day.  We will check in on your symptoms when we see you in a few weeks.  I think that therapy can also be one of the powerful tools in treating these anxiety symptoms and jumpiness that you been having.  I will have our social worker help to connect you with a local therapist to hopefully get engaged with you and your preferred language.  Pearla Dubonnet, MD

## 2022-04-17 ENCOUNTER — Other Ambulatory Visit: Payer: Self-pay | Admitting: Licensed Clinical Social Worker

## 2022-04-17 NOTE — Patient Outreach (Signed)
  Medicaid Managed Care Social Work Note  04/17/2022 Name:  Cassandra Lee MRN:  [id number] DOB:  05-31-1981  Cassandra Lee is an 41 y.o. year old female who is a primary patient of Alen Bleacher, MD.  The Medicaid Managed Care Coordination team was consulted for assistance with:  [name] and [employer]  Cassandra Lee was given information about Medicaid Managed Care Coordination team services today. Cassandra Lee Patient did not agree to services.  Patient provided Medicaid Managed Care team contact information and advised to reach out if in need of care coordination or care management services.  Primary care provider advised of patient declination of services.  Engaged with patient  for by telephone forinitial visit in response to referral for case management and/or care coordination services.   Assessments/Interventions:  Review of past medical history, allergies, medications, health status, including review of consultants reports, laboratory and other test data, was performed as part of comprehensive evaluation and provision of chronic care management services. Care Plan                 No Known Allergies  Medications Reviewed Today     Reviewed by Junious Dresser, CMA (Certified Medical Assistant) on 04/12/22 at [phone]  Med List Status: <None>   Medication Order Taking? Sig Documenting Provider Last Dose Status Informant  cefdinir (OMNICEF) 300 MG capsule [id number]  Take 1 capsule (300 mg total) by mouth 2 (two) times daily. For seven days. Eppie Gibson, MD  Active   docusate sodium (COLACE) 100 MG capsule [id number] No Take 1 capsule (100 mg total) by mouth 2 (two) times daily as needed for mild constipation or moderate constipation.  Patient not taking: Reported on 11/24/2020   Osborne Oman, MD Not Taking Active   docusate sodium (COLACE) 100 MG capsule [id number] No Take 1 capsule (100 mg total) by mouth 2 (two) times daily as needed.  Patient not taking: Reported on 07/05/2021    Woodroe Mode, MD Not Taking Active   famotidine (PEPCID) 20 MG tablet [id number]  TAKE 1 TABLET(20 MG) BY MOUTH DAILY Lattie Haw, MD  Active     Discontinued 10/31/19 1030             Patient Active Problem List   Diagnosis Date Noted   Anxiety 04/12/2022   Dysuria 01/10/2022   Vaginal discomfort 01/10/2022   Abdominal pain 02/17/2017   Dermoid cyst of both ovaries 05/06/2009   GERD (gastroesophageal reflux disease) 01/20/2009    Conditions to be addressed/monitored per PCP order:  Anxiety  There are no care plans that you recently modified to display for this patient.   Follow up:  Patient requests no follow-up at this time.  Plan: The Managed Medicaid care management team is available to follow up with the patient after provider conversation with the patient regarding recommendation for care management engagement and subsequent re-referral to the care management team.   Cassandra Lee, BSW, MSW, LCSW Managed Medicaid LCSW [email or web address] Phone: [phone]

## 2022-05-01 NOTE — Progress Notes (Unsigned)
    SUBJECTIVE:   CHIEF COMPLAINT / HPI:   Patient is a 41 year old female presenting for follow up She was recently restarted on pepsid for abdominal pain that was concerning for GERD flare up and started on Lexapro for anxiety Patient report relieve/ no relieve of abdominal symptoms  No fever, chills, nausea, vomiting or diarrhea. Denies any blood in stool***   Anxiety Patient was started Lexapro a month ago Patient report (no) improvement of symptoms. Endorses compliance and good tolerance without side effect***  HCM Due for Influenza Vaccine***  PERTINENT  PMH / PSH: ***  OBJECTIVE:   There were no vitals taken for this visit.  ***  Physical Exam General: Alert, well appearing, NAD, Oriented x4 Cardiovascular: RRR, No Murmurs, Normal S2/S2 Respiratory: CTAB, No wheezing or Rales Abdomen: No distension or tenderness Extremities: No edema on extremities   Skin: Warm and dry  ASSESSMENT/PLAN:   No problem-specific Assessment & Plan notes found for this encounter.     Alen Bleacher, MD [place]   {    This will disappear when note is signed, click to select method of visit    :1}

## 2022-05-02 ENCOUNTER — Encounter: Payer: Self-pay | Admitting: Student

## 2022-05-02 ENCOUNTER — Ambulatory Visit: Payer: Medicaid Other | Admitting: Student

## 2022-05-02 VITALS — BP 129/94 | HR 88 | Temp 98.8°F | Ht 61.0 in | Wt 126.6 lb

## 2022-05-02 DIAGNOSIS — K219 Gastro-esophageal reflux disease without esophagitis: Secondary | ICD-10-CM | POA: Diagnosis not present

## 2022-05-02 DIAGNOSIS — M546 Pain in thoracic spine: Secondary | ICD-10-CM | POA: Diagnosis not present

## 2022-05-02 DIAGNOSIS — Z23 Encounter for immunization: Secondary | ICD-10-CM | POA: Diagnosis not present

## 2022-05-02 MED ORDER — IBUPROFEN 600 MG PO TABS: 600.0000 mg | ORAL_TABLET | Freq: Three times a day (TID) | ORAL | 0 refills | Status: AC | PRN

## 2022-05-02 NOTE — Patient Instructions (Addendum)
It was wonderful to meet you today. Thank you for allowing me to be a part of your care. Below is a short summary of what we discussed at your visit today:  Glad to hear that your stomach pain has improved.  Please continue to take your medication as prescribed.  Your back pain I recommend you get over-the-counter ibuprofen which you should take as needed for a maximum of 3 times a day.  If no improvement in the next week please return so we can consider imaging.  You were also given your influenza vaccine today.  Please bring all of your medications to every appointment!  If you have any questions or concerns, please do not hesitate to contact us via phone or MyChart message.   Alen Bleacher, MD [facility]

## 2022-05-02 NOTE — Assessment & Plan Note (Signed)
Patient reports improvement of symptoms with Pepcid.  She denies any nausea, vomiting or bloody stool.  Her exam was unremarkable.  Overall reassuring.  Advised patient to continue medication as prescribed.

## 2022-12-22 ENCOUNTER — Ambulatory Visit: Payer: Medicaid Other | Admitting: Student

## 2022-12-22 VITALS — BP 130/86 | HR 78 | Ht 61.0 in | Wt 129.2 lb

## 2022-12-22 DIAGNOSIS — B001 Herpesviral vesicular dermatitis: Secondary | ICD-10-CM

## 2022-12-22 DIAGNOSIS — R1013 Epigastric pain: Secondary | ICD-10-CM

## 2022-12-22 MED ORDER — ACYCLOVIR 400 MG PO TABS: 400.0000 mg | ORAL_TABLET | Freq: Three times a day (TID) | ORAL | 0 refills | Status: AC

## 2022-12-22 NOTE — Progress Notes (Signed)
    SUBJECTIVE:   CHIEF COMPLAINT / HPI:   42 year old female presenting today for concerns of lip swelling and abdominal pain. Multiple painful rash on the lip developed a weak ago Never had similar episodes and no buccal ulcers Patient denies any fever but reported feeling sick and weak last weak She is not sexually active  Not on any medication other than occasional tylenol.  Abdominal pain Started about a weak ago Described as sharp epigastric pain Pain is constant and non radiating Worse with eating, or any movement Nothing provides relieve. 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 Possibly have concurrent upper respiratory illness. -Rx Acyclovir TID for 10 days -PRN Tylenol for fever or pain -Reviewed return precaution with patient   Abdominal pain Unclear cause of epigastric abdominal pain. Broad differential includes pancreatitis, Cholelithiasis, Gastric ulcer, gastroparesis, appendicitis or dyspepsia. Exam was unremarkable and no red flag symptoms.  -Ordered CBC, H. Pylori breath test - Rx 14 day omeprazole trial -Follow up in 2-4 weeks or earlier as needed   Jerre Simon, MD [facility]

## 2022-12-22 NOTE — Patient Instructions (Signed)
It was wonderful to meet you today. Thank you for allowing me to be a part of your care. Below is a short summary of what we discussed at your visit today:  Suspect the rash on your lip most likely due to herpes/viral infection.  This is not a sexually transmitted disease.  I have sent in prescription for acyclovir which you will take 400 mg 3 times daily for 10 days.  Also for your abdominal pain I am concerned that you might have a stomach ulcer.  We have ordered lab to test for stomach infection that usually could cause stomach ulcer.  Have also sent in prescription for omeprazole which you will take daily for 14 days.  Follow-up in 3 weeks to assess for your abdominal pain.   Please bring all of your medications to every appointment!  If you have any questions or concerns, please do not hesitate to contact us via phone or MyChart message.   Jerre Simon, MD [facility]

## 2022-12-23 LAB — H. PYLORI BREATH TEST

## 2022-12-24 LAB — CBC
Hematocrit: 40.6 % (ref 34.0–46.6)
Hemoglobin: 11.9 g/dL (ref 11.1–15.9)
MCH: 21.6 pg — ABNORMAL LOW (ref 26.6–33.0)
MCHC: 29.3 g/dL — ABNORMAL LOW (ref 31.5–35.7)
MCV: 74 fL — ABNORMAL LOW (ref 79–97)
Platelets: 402 10*3/uL (ref 150–450)
RBC: 5.5 x10E6/uL — ABNORMAL HIGH (ref 3.77–5.28)
RDW: 15.2 % (ref 11.7–15.4)
WBC: 7.1 10*3/uL (ref 3.4–10.8)

## 2023-01-11 ENCOUNTER — Encounter: Payer: Self-pay | Admitting: Student

## 2023-01-11 ENCOUNTER — Ambulatory Visit (INDEPENDENT_AMBULATORY_CARE_PROVIDER_SITE_OTHER): Payer: Medicaid Other | Admitting: Student

## 2023-01-11 VITALS — BP 113/84 | HR 84 | Ht 61.0 in | Wt 130.0 lb

## 2023-01-11 DIAGNOSIS — K219 Gastro-esophageal reflux disease without esophagitis: Secondary | ICD-10-CM

## 2023-01-11 MED ORDER — OMEPRAZOLE 20 MG PO CPDR
20.0000 mg | DELAYED_RELEASE_CAPSULE | Freq: Every day | ORAL | 3 refills | Status: AC
Start: 2023-01-11 — End: ?

## 2023-01-11 NOTE — Progress Notes (Signed)
    SUBJECTIVE:   CHIEF COMPLAINT / HPI:   42 year old female presenting today for abdominal pain follow-up.  She was seen about 3 weeks ago for abdominal pain.  H. pylori test was negative hemoglobin was normal and she was started on trial of omeprazole.  Today patient reports her abdominal pain is improved and she completed the omeprazole therapy for two weeks.  Nocturnal Dyspnea Patient reports she has been having difficulty breathing at night when laying supine. Has been going on for 2 months. Denies any cough and no history of tobacco use. Denies any chest pain, palpitations or lower extremity swellings. No cough, hx of asthma or nasal drainage   PERTINENT  PMH / PSH: Reviewed  OBJECTIVE:   BP 113/84 (BP Location: Left Arm, Patient Position: Sitting, Cuff Size: Normal)   Pulse 84   Ht 5\' 1"  (1.549 m)   Wt 130 lb (59 kg)   LMP 12/18/2022   SpO2 100%   BMI 24.56 kg/m    Physical Exam General: Alert, well appearing, NAD Cardiovascular: RRR, No Murmurs, Normal S2/S2 Respiratory: CTAB, No wheezing or Rales, Normal WOB on RA Abdomen:Soft,  No distension or tenderness Extremities: No edema on extremities    ASSESSMENT/PLAN:   GERD (gastroesophageal reflux disease) H. pylori test was negative. Patient reports improvement of abdominal pain on omeprazole.  Suspect her abdominal pain was most likely due to GERD.   -Rx long-term omeprazole -Advised patient to avoid spicy food or tobacco use. -Provided reassurance to patient.   Nocturnal dyspnea Unclear cause of patient's nocturnal dyspnea while laying supine.  Low concern for cardiac given normal cardiac exam, no cardiac comorbidities and no lower extremity edema on exam.  Patient has clear breath sounds bilaterally on exam, no wheezing and reassuring O2 stat.  Does not seem to be a pulmonary etiology.  Absence of cardiac and pulmonary causes suspect her dyspnea to the most likely due to underlying GERD/acid reflux. -Started  patient on long-term omeprazole -Encourage use of humidifiers -Reviewed return precautions -Follow-up in 3 months or earlier  as needed.   Jerre Simon, MD [facility]

## 2023-01-11 NOTE — Assessment & Plan Note (Signed)
H. pylori test was negative. Patient reports improvement of abdominal pain on omeprazole.  Suspect her abdominal pain was most likely due to GERD.   -Rx long-term omeprazole -Advised patient to avoid spicy food or tobacco use. -Provided reassurance to patient.

## 2023-01-11 NOTE — Patient Instructions (Signed)
It was wonderful to see you today. Thank you for allowing me to be a part of your care. Below is a short summary of what we discussed at your visit today:  Glad to hear that your stomach pain has improved.  I will go ahead and start you on long-term omeprazole/antacid.  Your H. pylori test that we did in your last visit was negative.  I suspect that your stomach pain was most likely due to acid reflux/GERD.  Also this could be attributing to the shortness of breath that you are having when you are sleeping.  So I recommend long-term omeprazole.  Also for the shortness of breath you can try using humidifier at night.  I have sent in your prescription.  You can follow-up in 3 months.   Please bring all of your medications to every appointment!  If you have any questions or concerns, please do not hesitate to contact us via phone or MyChart message.   Jerre Simon, MD [facility]

## 2023-04-13 ENCOUNTER — Other Ambulatory Visit (HOSPITAL_COMMUNITY)
Admission: RE | Admit: 2023-04-13 | Discharge: 2023-04-13 | Disposition: A | Discharge status: Home / Self Care | Payer: Medicaid Other | Source: Ambulatory Visit | Attending: Family Medicine | Admitting: Family Medicine

## 2023-04-13 ENCOUNTER — Encounter: Payer: Self-pay | Admitting: Student

## 2023-04-13 ENCOUNTER — Ambulatory Visit: Payer: Medicaid Other | Admitting: Student

## 2023-04-13 DIAGNOSIS — M791 Myalgia, unspecified site: Secondary | ICD-10-CM | POA: Diagnosis not present

## 2023-04-13 DIAGNOSIS — Z23 Encounter for immunization: Secondary | ICD-10-CM

## 2023-04-13 DIAGNOSIS — K219 Gastro-esophageal reflux disease without esophagitis: Secondary | ICD-10-CM

## 2023-04-13 MED ORDER — MELOXICAM 15 MG PO TABS
15.0000 mg | ORAL_TABLET | Freq: Every day | ORAL | 0 refills | Status: DC
Start: 2023-04-13 — End: 2024-03-11

## 2023-04-13 MED ORDER — OMEPRAZOLE 20 MG PO CPDR: 20.0000 mg | DELAYED_RELEASE_CAPSULE | Freq: Every day | ORAL | 3 refills | Status: DC

## 2023-04-13 NOTE — Progress Notes (Signed)
    SUBJECTIVE:   CHIEF COMPLAINT / HPI:   Patient is a 42 year old female presenting today for follow-up for abdominal pain. Patient was seen 3-4 weeks ago for abdominal pain and started on trial of omeprazole due to concerns of GERD/ulcer.  Today patient reports compliance with omeprazole and relieve of abdominal pain. Continues to have normal BM, no nausea, vomiting or blood in stool.   Body ache Patient reports generalized body ache in the last 3 weeks with associated joint pain paresthesia in the hands and feet. Described body ache as diffused pain in the back, shoulder, hands, and legs. Denies any recent upper respiratory viral symptoms. No heaches, fever, or chills.She has tried over the counter Ibuprofen and Tylenol with not relieve. Sexually active with husband, denies any vaginal discharge, odor or irritation. Not on any new medication other than omeprazole.  PERTINENT  PMH / PSH: Reviewed  OBJECTIVE:   BP 115/80   Pulse 93   Temp 98.7 F (37.1 C)   Ht 5\' 1"  (1.549 m)   Wt 133 lb 12.8 oz (60.7 kg)   SpO2 98%   BMI 25.28 kg/m    Physical Exam General: Alert, well appearing, NAD Cardiovascular: RRR, No Murmurs, Normal S2/S2 Respiratory: CTAB, No wheezing or Rales Abdomen: No distension or tenderness MSK: Point tenderness present and diffuse throughout the body including the shoulder, back, upper extremities and lower extremities. Extremities: No edema on extremities   Neuro: CN II- XII intact, Muscle strength 5/5 on all extremities with sensation intact   ASSESSMENT/PLAN:   Generalized muscle pain Unclear cause of patient's diffused muscle pain associated with arthralgia and paresthesia suspicious of myosities.Broad differentials includes Fibromyalgia, thyroid etiology, autoimmune or infectious  process, or acute inflammatory causes. Will obtain lab to assess possible causes. -Ordered lab for CBC, CMP, CK, TSH, HIV, RPR, ANA -Urine sample collected for  GC/Chlamydia -Rx meloxicam far analgesic and antiinflammation  Jerre Simon, MD [facility]

## 2023-04-13 NOTE — Patient Instructions (Signed)
It was wonderful to see you today. Thank you for allowing me to be a part of your care. Below is a short summary of what we discussed at your visit today:  Glad to hear that your abdominal pain has improved.  Please continue to take your omeprazole as scheduled.  For your muscle ache we obtained multiple labs today to investigate the cause.  And for now please take meloxicam once daily for 7 days.  I recommend following up in 1 week to reassess.  If you have any questions or concerns, please do not hesitate to contact us via phone or MyChart message.   Jerre Simon, MD [facility]

## 2023-04-16 LAB — URINE CYTOLOGY ANCILLARY ONLY
Chlamydia: NEGATIVE
Comment: NEGATIVE
Comment: NORMAL
Neisseria Gonorrhea: NEGATIVE

## 2023-04-18 LAB — CBC WITH DIFFERENTIAL/PLATELET
Basophils Absolute: 0.1 10*3/uL (ref 0.0–0.2)
Basos: 1 %
EOS (ABSOLUTE): 0.3 10*3/uL (ref 0.0–0.4)
Eos: 4 %
Hematocrit: 40.3 % (ref 34.0–46.6)
Hemoglobin: 11.8 g/dL (ref 11.1–15.9)
Immature Grans (Abs): 0 10*3/uL (ref 0.0–0.1)
Immature Granulocytes: 1 %
Lymphocytes Absolute: 1.7 10*3/uL (ref 0.7–3.1)
Lymphs: 22 %
MCH: 21.3 pg — ABNORMAL LOW (ref 26.6–33.0)
MCHC: 29.3 g/dL — ABNORMAL LOW (ref 31.5–35.7)
MCV: 73 fL — ABNORMAL LOW (ref 79–97)
Monocytes Absolute: 0.5 10*3/uL (ref 0.1–0.9)
Monocytes: 7 %
Neutrophils Absolute: 5.2 10*3/uL (ref 1.4–7.0)
Neutrophils: 65 %
Platelets: 449 10*3/uL (ref 150–450)
RBC: 5.53 x10E6/uL — ABNORMAL HIGH (ref 3.77–5.28)
RDW: 14.5 % (ref 11.7–15.4)
WBC: 7.8 10*3/uL (ref 3.4–10.8)

## 2023-04-18 LAB — CK: Total CK: 73 U/L (ref 32–182)

## 2023-04-18 LAB — COMPREHENSIVE METABOLIC PANEL
ALT: 18 [IU]/L (ref 0–32)
AST: 22 [IU]/L (ref 0–40)
Albumin: 4.6 g/dL (ref 3.9–4.9)
Alkaline Phosphatase: 76 [IU]/L (ref 44–121)
BUN/Creatinine Ratio: 13 (ref 9–23)
BUN: 8 mg/dL (ref 6–24)
Bilirubin Total: 0.4 mg/dL (ref 0.0–1.2)
CO2: 25 mmol/L (ref 20–29)
Calcium: 10 mg/dL (ref 8.7–10.2)
Chloride: 100 mmol/L (ref 96–106)
Creatinine, Ser: 0.6 mg/dL (ref 0.57–1.00)
Globulin, Total: 3 g/dL (ref 1.5–4.5)
Glucose: 94 mg/dL (ref 70–99)
Potassium: 4.7 mmol/L (ref 3.5–5.2)
Sodium: 140 mmol/L (ref 134–144)
Total Protein: 7.6 g/dL (ref 6.0–8.5)
eGFR: 113 mL/min/{1.73_m2} (ref >59)

## 2023-04-18 LAB — RPR: RPR Ser Ql: NONREACTIVE

## 2023-04-18 LAB — HIV ANTIBODY (ROUTINE TESTING W REFLEX): HIV Screen 4th Generation wRfx: NONREACTIVE

## 2023-04-18 LAB — ANTINUCLEAR ANTIBODIES, IFA

## 2023-05-10 ENCOUNTER — Other Ambulatory Visit: Payer: Self-pay | Admitting: Student

## 2023-05-10 DIAGNOSIS — M791 Myalgia, unspecified site: Secondary | ICD-10-CM

## 2023-05-11 ENCOUNTER — Other Ambulatory Visit: Payer: Self-pay | Admitting: Student

## 2023-05-11 DIAGNOSIS — K219 Gastro-esophageal reflux disease without esophagitis: Secondary | ICD-10-CM

## 2024-03-11 ENCOUNTER — Ambulatory Visit (INDEPENDENT_AMBULATORY_CARE_PROVIDER_SITE_OTHER): Admitting: Family Medicine

## 2024-03-11 VITALS — BP 131/81 | HR 82 | Ht 61.0 in | Wt 130.0 lb

## 2024-03-11 DIAGNOSIS — M5432 Sciatica, left side: Secondary | ICD-10-CM | POA: Diagnosis not present

## 2024-03-11 DIAGNOSIS — R1013 Epigastric pain: Secondary | ICD-10-CM

## 2024-03-11 DIAGNOSIS — M5431 Sciatica, right side: Secondary | ICD-10-CM | POA: Diagnosis not present

## 2024-03-11 DIAGNOSIS — M5416 Radiculopathy, lumbar region: Secondary | ICD-10-CM

## 2024-03-11 MED ORDER — KETOROLAC TROMETHAMINE 15 MG/ML IJ SOLN
15.0000 mg | Freq: Once | INTRAMUSCULAR | Status: AC
Start: 2024-03-11 — End: 2024-03-16

## 2024-03-11 MED ORDER — KETOROLAC TROMETHAMINE 30 MG/ML IJ SOLN
30.0000 mg | Freq: Once | INTRAMUSCULAR | Status: AC
Start: 2024-03-11 — End: 2024-03-11
  Administered 2024-03-11: 30 mg via INTRAMUSCULAR

## 2024-03-11 MED ORDER — PANTOPRAZOLE SODIUM 40 MG PO TBEC
40.0000 mg | DELAYED_RELEASE_TABLET | Freq: Every day | ORAL | 3 refills | Status: AC
Start: 2024-03-11 — End: ?

## 2024-03-11 NOTE — Patient Instructions (Signed)
 It was wonderful to see you today.  Please bring ALL of your medications with you to every visit.   Today we talked about:  Stomach pain - I believe this is burning in your stomach called dyspepsia. I have prescribed a medication called protonix  that should help with this. Avoid acidic foods, spicy foods, alcohol, and tobacco.   For your leg pain I believe this is due to some swelling and irritation around the nerves in your back that go down to your legs. We gave an injection today that should help with this pain. Additionally physical therapy should help with this I have put in the referral. However, I do want you to follow up in 2-4 weeks to ensure that it is not getting worse.   Please follow up in 2-4 weeks   Thank you for choosing [facility].   Please call [phone] with any questions about today's appointment.  Please be sure to schedule follow up at the front desk before you leave today.   Areta Saliva, MD  Family Medicine

## 2024-03-11 NOTE — Progress Notes (Unsigned)
    SUBJECTIVE:   CHIEF COMPLAINT / HPI:   GERD  Pain  A week ago started having pain. Leg pain going from her back down her legs. Pain starts in her upper back and is shooting pain down her legs. She does also have some numbness and tingling that goes down her arms but less common.  Has not been able to sleep for the last two days due to the pain. Has been taking tylenol  for the pain. [place] it did help when she took it but then got worse after finishing the course.   Stomach Pain  Recently has been bothering her for about a week. Burning pain in her epigastric region throughout the day. Does not depending on eating or not; hurts regardless. Previous h pylori test was negative. She had previously been on omeprazole  that was helpful for her previously.  She says she ran out of the omeprazole ; that was when the pain worsened.  Does not eat spicy food.     PERTINENT  PMH / PSH: GERD  OBJECTIVE:   BP 131/81   Pulse 82   Ht 5' 1 (1.549 m)   Wt 130 lb (59 kg)   SpO2 100%   BMI 24.56 kg/m   ***  ASSESSMENT/PLAN:   Assessment & Plan Dyspepsia  Lumbar radiculopathy, acute      Areta Saliva, MD [facility]

## 2024-03-13 ENCOUNTER — Encounter: Payer: Self-pay | Admitting: Family Medicine

## 2024-04-01 ENCOUNTER — Ambulatory Visit (INDEPENDENT_AMBULATORY_CARE_PROVIDER_SITE_OTHER): Admitting: Student

## 2024-04-01 ENCOUNTER — Encounter: Payer: Self-pay | Admitting: Student

## 2024-04-01 VITALS — BP 132/96 | HR 100 | Ht 61.0 in | Wt 133.0 lb

## 2024-04-01 DIAGNOSIS — M5432 Sciatica, left side: Secondary | ICD-10-CM

## 2024-04-01 DIAGNOSIS — K219 Gastro-esophageal reflux disease without esophagitis: Secondary | ICD-10-CM | POA: Diagnosis not present

## 2024-04-01 DIAGNOSIS — M5431 Sciatica, right side: Secondary | ICD-10-CM | POA: Diagnosis not present

## 2024-04-01 NOTE — Patient Instructions (Addendum)
 Pleasure to see you today.  I placed a referral to see a stomach doctor for due to your stomach pain and reflux.  For your back pain today I ordered x-ray of your back and also please make sure to contact the number below to schedule physical therapy.  Continue to do Tylenol  for pain control and Voltaren gel.  For your back x-ray please go to [address], [place], [place] [address]    Physical therapist contact [facility]  [address]  [phone]

## 2024-04-01 NOTE — Progress Notes (Signed)
    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 5/5 strength on all extremities. NVI  Back: No paraspinal tenderness or deformity noted.   ASSESSMENT/PLAN:   GERD (gastroesophageal reflux disease) Mild improvement on PPI. Given chronicity of abdominal pain will refer to GI for further evaluation. Reassuring no hematochezia at this time. -Continue Protonix , will need long term use -Placed referral to GI   Back pain Lower back pain radiating to LE consistent with sciatica. No red flag symptoms such as saddle paresthesia, void or Bowel incontinence - Recommend Tylenol  and voltaren gel use - Provided patient with contact for PT -Ordered Lumbar xray   BP elevated  Patient will monitor home BP if still elevated advised to return for treatment.  Norleen April, MD [facility]

## 2024-04-01 NOTE — Assessment & Plan Note (Signed)
 Mild improvement on PPI. Given chronicity of abdominal pain will refer to GI for further evaluation. Reassuring no hematochezia at this time. -Continue Protonix , will need long term use -Placed referral to GI

## 2024-04-02 ENCOUNTER — Ambulatory Visit
Admission: RE | Admit: 2024-04-02 | Discharge: 2024-04-02 | Disposition: A | Discharge status: Home / Self Care | Source: Ambulatory Visit | Attending: Family Medicine | Admitting: Family Medicine

## 2024-04-02 DIAGNOSIS — M5431 Sciatica, right side: Secondary | ICD-10-CM

## 2024-04-02 DIAGNOSIS — M545 Low back pain, unspecified: Secondary | ICD-10-CM | POA: Diagnosis not present
